# Patient Record
Sex: Female | Born: 1975 | Race: Black or African American | Hispanic: No | Marital: Married | Smoking: Never smoker
Health system: Southern US, Community
[De-identification: ages and names within clinical notes are randomized; demographics above are authoritative.]

## PROBLEM LIST (undated history)

## (undated) DIAGNOSIS — B029 Zoster without complications: Secondary | ICD-10-CM

## (undated) DIAGNOSIS — G40909 Epilepsy, unspecified, not intractable, without status epilepticus: Secondary | ICD-10-CM

## (undated) DIAGNOSIS — I341 Nonrheumatic mitral (valve) prolapse: Secondary | ICD-10-CM

## (undated) DIAGNOSIS — L309 Dermatitis, unspecified: Secondary | ICD-10-CM

## (undated) DIAGNOSIS — J45909 Unspecified asthma, uncomplicated: Secondary | ICD-10-CM

## (undated) HISTORY — PX: KNEE SURGERY: SHX244

## (undated) HISTORY — PX: ORBITAL FRACTURE SURGERY: SHX725

---

## 2019-01-15 ENCOUNTER — Other Ambulatory Visit: Payer: Self-pay

## 2019-01-15 ENCOUNTER — Emergency Department
Admission: EM | Admit: 2019-01-15 | Discharge: 2019-01-15 | Disposition: A | Discharge status: Home / Self Care | Payer: Self-pay | Attending: Emergency Medicine | Admitting: Emergency Medicine

## 2019-01-15 ENCOUNTER — Emergency Department: Payer: Self-pay

## 2019-01-15 ENCOUNTER — Encounter: Payer: Self-pay | Admitting: Emergency Medicine

## 2019-01-15 DIAGNOSIS — Y9389 Activity, other specified: Secondary | ICD-10-CM | POA: Insufficient documentation

## 2019-01-15 DIAGNOSIS — S6392XA Sprain of unspecified part of left wrist and hand, initial encounter: Secondary | ICD-10-CM

## 2019-01-15 DIAGNOSIS — Y999 Unspecified external cause status: Secondary | ICD-10-CM | POA: Insufficient documentation

## 2019-01-15 DIAGNOSIS — S638X2A Sprain of other part of left wrist and hand, initial encounter: Secondary | ICD-10-CM | POA: Insufficient documentation

## 2019-01-15 DIAGNOSIS — Y9289 Other specified places as the place of occurrence of the external cause: Secondary | ICD-10-CM | POA: Insufficient documentation

## 2019-01-15 DIAGNOSIS — X500XXA Overexertion from strenuous movement or load, initial encounter: Secondary | ICD-10-CM | POA: Insufficient documentation

## 2019-01-15 HISTORY — DX: Epilepsy, unspecified, not intractable, without status epilepticus: G40.909

## 2019-01-15 MED ORDER — IBUPROFEN 600 MG PO TABS
600.0000 mg | ORAL_TABLET | Freq: Once | ORAL | Status: AC
  Administered 2019-01-15: 600 mg via ORAL
  Filled 2019-01-15: qty 1

## 2019-01-15 NOTE — Discharge Instructions (Addendum)
Ice, take ibuprofen for pain.  Use Ace wrap for comfort.  Follow-up with your doctor.

## 2019-01-15 NOTE — ED Triage Notes (Signed)
Pt says she reached out to catch her niece from falling yesterday and injured her left 4th digit; no swelling present; pt says she was awakened from sleep with pain in the middle of her hand; says she doesn't think it's broken, just sprained;

## 2019-01-15 NOTE — ED Provider Notes (Signed)
[facility] Emergency Department Provider Note  ____________________________________________  Time seen: Approximately 6:36 AM  I have reviewed the triage vital signs and the nursing notes.   HISTORY  Chief Complaint Hand Pain   HPI Melanie Mccann is a 43 y.o. female who presents for evaluation of left hand pain.  Patient reports that she was holding her niece yesterday to prevent her from falling and started having pain over the fourth and fifth metacarpals.  The pain is mild/moderate, throbbing/soreness, constant, worse with movement of the hand.  No other injuries.  She has not taken anything for the pain   Past Medical History:  Diagnosis Date   Epilepsy ([place])     There are no active problems to display for this patient.   Past Surgical History:  Procedure Laterality Date   KNEE SURGERY Right    ORBITAL FRACTURE SURGERY Left     Prior to Admission medications   Not on File    Allergies Patient has no known allergies.  History reviewed. No pertinent family history.  Social History Social History   Tobacco Use   Smoking status: Never Smoker   Smokeless tobacco: Never Used  Substance Use Topics   Alcohol use: Yes    Comment: seldom   Drug use: Never    Review of Systems  Constitutional: Negative for fever. Eyes: Negative for visual changes. ENT: Negative for sore throat. Neck: No neck pain  Cardiovascular: Negative for chest pain. Respiratory: Negative for shortness of breath. Gastrointestinal: Negative for abdominal pain, vomiting or diarrhea. Genitourinary: Negative for dysuria. Musculoskeletal: Negative for back pain. + L hand pain Skin: Negative for rash. Neurological: Negative for headaches, weakness or numbness. Psych: No SI or HI  ____________________________________________   PHYSICAL EXAM:  VITAL SIGNS: ED Triage Vitals  Enc Vitals Group     BP 01/15/19 0508 117/79     Pulse Rate 01/15/19 0508 79    Resp 01/15/19 0508 16     Temp 01/15/19 0508 99.2 F (37.3 C)     Temp Source 01/15/19 0508 Oral     SpO2 01/15/19 0508 100 %     Weight 01/15/19 0511 155 lb (70.3 kg)     Height 01/15/19 0511 5\' 11"  (1.803 m)     Head Circumference --      Peak Flow --      Pain Score 01/15/19 0511 8     Pain Loc --      Pain Edu? --      Excl. in [place]? --     Constitutional: Alert and oriented. Well appearing and in no apparent distress. HEENT:      Head: Normocephalic and atraumatic.         Eyes: Conjunctivae are normal. Sclera is non-icteric.       Mouth/Throat: Mucous membranes are moist.       Neck: Supple with no signs of meningismus. Cardiovascular: Regular rate and rhythm.  Respiratory: Normal respiratory effort.  Musculoskeletal: No bony deformities or tenderness.  No bruising, no swelling. Neurologic: Normal speech and language. Face is symmetric. Moving all extremities. No gross focal neurologic deficits are appreciated. Skin: Skin is warm, dry and intact. No rash noted. Psychiatric: Mood and affect are normal. Speech and behavior are normal.  ____________________________________________   LABS (all labs ordered are listed, but only abnormal results are displayed)  Labs Reviewed - No data to display ____________________________________________  EKG  none  ____________________________________________  RADIOLOGY  I have personally reviewed the  images performed during this visit and I agree with the Radiologist's read.   Interpretation by Radiologist:  Dg Hand Complete Left  Result Date: 01/15/2019 CLINICAL DATA:  Left hand pain, woke from sleep, suspected injury of the fourth digit EXAM: LEFT HAND - COMPLETE 3+ VIEW COMPARISON:  None. FINDINGS: There is no evidence of fracture or dislocation. There is no evidence of arthropathy or other focal bone abnormality. Soft tissues are unremarkable. IMPRESSION: Negative. Electronically Signed   By: Kreg Shropshire M.D.   On: 01/15/2019  05:42     ____________________________________________   PROCEDURES  Procedure(s) performed: None Procedures Critical Care performed:  None ____________________________________________   INITIAL IMPRESSION / ASSESSMENT AND PLAN / ED COURSE   43 y.o. female who presents for evaluation of left hand pain.  Presentation concerning for sprain.  X-ray with no fracture.  No laceration.  No swelling.  Ace wrap to the hand for comfort.  Ibuprofen given.  Recommended icing and ibuprofen.  Discussed my standard return precautions       As part of my medical decision making, I reviewed the following data within the electronic MEDICAL RECORD NUMBER Nursing notes reviewed and incorporated, Old chart reviewed, Radiograph reviewed , Notes from prior ED visits and [place] Controlled Substance Database   Patient was evaluated in Emergency Department today for the symptoms described in the history of present illness. Patient was evaluated in the context of the global COVID-19 pandemic, which necessitated consideration that the patient might be at risk for infection with the SARS-CoV-2 virus that causes COVID-19. Institutional protocols and algorithms that pertain to the evaluation of patients at risk for COVID-19 are in a state of rapid change based on information released by regulatory bodies including the CDC and federal and state organizations. These policies and algorithms were followed during the patient's care in the ED.   ____________________________________________   FINAL CLINICAL IMPRESSION(S) / ED DIAGNOSES   Final diagnoses:  Sprain of left hand, initial encounter      NEW MEDICATIONS STARTED DURING THIS VISIT:  ED Discharge Orders    None       Note:  This document was prepared using Dragon voice recognition software and may include unintentional dictation errors.    Nita Sickle, MD 01/15/19 [phone]

## 2019-03-01 ENCOUNTER — Emergency Department: Payer: Self-pay

## 2019-03-01 ENCOUNTER — Emergency Department
Admission: EM | Admit: 2019-03-01 | Discharge: 2019-03-02 | Disposition: A | Discharge status: Home / Self Care | Payer: Self-pay | Attending: Student | Admitting: Student

## 2019-03-01 ENCOUNTER — Other Ambulatory Visit: Payer: Self-pay

## 2019-03-01 DIAGNOSIS — Z20828 Contact with and (suspected) exposure to other viral communicable diseases: Secondary | ICD-10-CM | POA: Insufficient documentation

## 2019-03-01 DIAGNOSIS — R05 Cough: Secondary | ICD-10-CM | POA: Insufficient documentation

## 2019-03-01 DIAGNOSIS — R0602 Shortness of breath: Secondary | ICD-10-CM | POA: Insufficient documentation

## 2019-03-01 DIAGNOSIS — R059 Cough, unspecified: Secondary | ICD-10-CM

## 2019-03-01 DIAGNOSIS — R0789 Other chest pain: Secondary | ICD-10-CM | POA: Insufficient documentation

## 2019-03-01 LAB — CBC
HCT: 37.8 % (ref 36.0–46.0)
Hemoglobin: 12.5 g/dL (ref 12.0–15.0)
MCH: 29.2 pg (ref 26.0–34.0)
MCHC: 33.1 g/dL (ref 30.0–36.0)
MCV: 88.3 fL (ref 80.0–100.0)
Platelets: 176 10*3/uL (ref 150–400)
RBC: 4.28 MIL/uL (ref 3.87–5.11)
RDW: 13.5 % (ref 11.5–15.5)
WBC: 5.9 10*3/uL (ref 4.0–10.5)
nRBC: 0 % (ref 0.0–0.2)

## 2019-03-01 LAB — BASIC METABOLIC PANEL
Anion gap: 10 (ref 5–15)
BUN: 10 mg/dL (ref 6–20)
CO2: 23 mmol/L (ref 22–32)
Calcium: 9.4 mg/dL (ref 8.9–10.3)
Chloride: 105 mmol/L (ref 98–111)
Creatinine, Ser: 0.79 mg/dL (ref 0.44–1.00)
GFR calc Af Amer: >60 mL/min (ref >60)
GFR calc non Af Amer: >60 mL/min (ref >60)
Glucose, Bld: 84 mg/dL (ref 70–99)
Potassium: 3.6 mmol/L (ref 3.5–5.1)
Sodium: 138 mmol/L (ref 135–145)

## 2019-03-01 LAB — POC SARS CORONAVIRUS 2 AG: SARS Coronavirus 2 Ag: NEGATIVE

## 2019-03-01 LAB — POCT PREGNANCY, URINE: Preg Test, Ur: NEGATIVE

## 2019-03-01 LAB — FIBRIN DERIVATIVES D-DIMER (ARMC ONLY): Fibrin derivatives D-dimer (ARMC): 222.29 ng/mL (FEU) (ref 0.00–499.00)

## 2019-03-01 LAB — TROPONIN I (HIGH SENSITIVITY)
Troponin I (High Sensitivity): 3 ng/L (ref <18)
Troponin I (High Sensitivity): <2 ng/L (ref <18)

## 2019-03-01 MED ORDER — ALBUTEROL SULFATE HFA 108 (90 BASE) MCG/ACT IN AERS: 2.0000 | INHALATION_SPRAY | Freq: Four times a day (QID) | RESPIRATORY_TRACT | 0 refills | Status: AC | PRN

## 2019-03-01 MED ORDER — SODIUM CHLORIDE 0.9% FLUSH: 3.0000 mL | Freq: Once | INTRAVENOUS | Status: DC

## 2019-03-01 NOTE — ED Notes (Signed)
Recollect green and blue sent to lab

## 2019-03-01 NOTE — ED Triage Notes (Addendum)
Pt arrives to ED via POV from home with c/o CP, SHOB, and non-productive cough. Pt reports cough started on Tuesday, and the CP and SHOB started 1.5 hrs PTA. Pt reports the CP is centralized and non-radiatiating and increases with coughing. Pt denies any c/o N/V/D or fever. No recent Covid testing or exposures. Pt is A&O, in NAD; RR even, regular, and unlabored.

## 2019-03-01 NOTE — ED Notes (Signed)
X-ray at bedside

## 2019-03-01 NOTE — Discharge Instructions (Signed)
Thank you for letting us take care of you in the emergency department.   You were swabbed for COVID today. You should hear your results in 1-3 days if they are positive.   In the meantime, it is important to take precautions in case you are positive. This includes quarantining, wearing a mask, and social distancing.   Continue to take over the counter acetaminophen and ibuprofen as directed on the box to help with fevers as well as aches and pains.   Please return to the ER for any new or worsening symptoms, such as difficulty breathing, vomiting and diarrhea, or chest pain.

## 2019-03-01 NOTE — ED Provider Notes (Signed)
[facility] Emergency Department Provider Note  ____________________________________________   None    (approximate)  I have reviewed the triage vital signs and the nursing notes.  History  Chief Complaint Shortness of Breath, Chest Pain, and Cough    HPI Melanie Mccann is a 43 y.o. female who presents to the ED for chest pain, shortness of breath, nonproductive cough.  Patient reports cough started on Tuesday. Cough is dry and non-productive. A/w generalized fatigue. Chest pain and shortness of breath started 1 to 2 hours prior to arrival here.  Chest pain is central in location, does not radiate, described as an aching, 7/10 in severity.  Chest pain worsens with coughing.  Patient denies any fever, nausea, vomiting, diarrhea.  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   Past Medical Hx Past Medical History:  Diagnosis Date  . Epilepsy (H[facility])     Problem List There are no active problems to display for this patient.   Past Surgical Hx Past Surgical History:  Procedure Laterality Date  . KNEE SURGERY Right   . ORBITAL FRACTURE SURGERY Left     Medications Prior to Admission medications   Not on File    Allergies Patient has no known allergies.  Family Hx No family history on file.  Social Hx Social History   Tobacco Use  . Smoking status: Never Smoker  . Smokeless tobacco: Never Used  Substance Use Topics  . Alcohol use: Yes    Comment: seldom  . Drug use: Never     Review of Systems  Constitutional: Negative for fever, chills. Eyes: Negative for visual changes. ENT: Negative for sore throat. Cardiovascular: + for chest pain. Respiratory: + for cough, shortness of breath. Gastrointestinal: Negative for nausea, vomiting.  Genitourinary: Negative for dysuria. Musculoskeletal: Negative for leg swelling. Skin: Negative for rash. Neurological:  Negative for for headaches.   Physical Exam  Vital Signs: ED Triage Vitals  Enc Vitals Group     BP 03/01/19 2022 (!) 123/94     Pulse Rate 03/01/19 2022 91     Resp 03/01/19 2022 17     Temp 03/01/19 2022 99.3 F (37.4 C)     Temp Source 03/01/19 2022 Oral     SpO2 03/01/19 2022 99 %     Weight 03/01/19 2019 160 lb (72.6 kg)     Height 03/01/19 2019 6' (1.829 m)     Head Circumference --      Peak Flow --      Pain Score 03/01/19 2019 7     Pain Loc --      Pain Edu? --      Excl. in [place]? --     Constitutional: Alert and oriented. Appears somewhat fatigued. Frequent dry cough on exam.  Head: Normocephalic. Atraumatic. Eyes: Conjunctivae clear. Sclera anicteric. Nose: No congestion. No rhinorrhea. Mouth/Throat: Wearing mask.  Neck: No stridor.   Cardiovascular: Normal rate, regular rhythm. Extremities well perfused. Respiratory: Normal respiratory effort.  Lungs CTAB. Satting 99% on RA. No hypoxia. Frequent dry cough.  Gastrointestinal: Soft. Non-tender. Non-distended.  Musculoskeletal: No lower extremity edema. No deformities. Neurologic:  Normal speech and language. No gross focal neurologic deficits are appreciated.  Skin: Skin is warm, dry and intact. No rash noted. Psychiatric: Mood and affect are appropriate for situation.  EKG  Personally reviewed.   Rate: 81 Rhythm: sinus Axis: normal Intervals: WNL TWI in III  No acute ischemic changes No STEMI    Radiology  XR: IMPRESSION:  No active disease.   Procedures  Procedure(s) performed (including critical care):  Procedures   Initial Impression / Assessment and Plan / ED Course  43 y.o. female who presents to the ED for non-productive cough, SOB, chest pain.  Ddx: bronchitis, COVID, PNA, ACS, PE  Will obtain labs, imaging, EKG.  EKG w/o acute ischemic changes. Troponin x 2 negative. XR negative. D-dimer negative. Initial COVID antigen testing negative, follow up PCR swab ordered. Given  negative work up, plan for discharge home. Patient understands her swab is pending at this time, and need for quarantining until results. Advised supportive care, given return precautions. Patient comfortable with plan.      Final Clinical Impression(s) / ED Diagnosis  Final diagnoses:  Cough       Note:  This document was prepared using Dragon voice recognition software and may include unintentional dictation errors.   Lilia Pro., MD 03/01/19 [phone]

## 2019-03-02 LAB — SARS CORONAVIRUS 2 (TAT 6-24 HRS): SARS Coronavirus 2: NEGATIVE

## 2019-03-02 NOTE — ED Notes (Signed)
E-signature not working at this time. Pt verbalized understanding of D/C instructions, prescriptions and follow up care with no further questions at this time. Pt in NAD and ambulatory at time of D/C.  

## 2019-04-23 ENCOUNTER — Encounter: Payer: Self-pay | Admitting: Emergency Medicine

## 2019-04-23 ENCOUNTER — Emergency Department
Admission: EM | Admit: 2019-04-23 | Discharge: 2019-04-23 | Disposition: A | Discharge status: Home / Self Care | Payer: BLUE CROSS/BLUE SHIELD | Attending: Emergency Medicine | Admitting: Emergency Medicine

## 2019-04-23 ENCOUNTER — Other Ambulatory Visit: Payer: Self-pay

## 2019-04-23 DIAGNOSIS — R519 Headache, unspecified: Secondary | ICD-10-CM | POA: Diagnosis present

## 2019-04-23 DIAGNOSIS — K029 Dental caries, unspecified: Secondary | ICD-10-CM | POA: Diagnosis not present

## 2019-04-23 DIAGNOSIS — J45909 Unspecified asthma, uncomplicated: Secondary | ICD-10-CM | POA: Diagnosis not present

## 2019-04-23 DIAGNOSIS — Z79899 Other long term (current) drug therapy: Secondary | ICD-10-CM | POA: Diagnosis not present

## 2019-04-23 DIAGNOSIS — J0100 Acute maxillary sinusitis, unspecified: Secondary | ICD-10-CM | POA: Diagnosis not present

## 2019-04-23 HISTORY — DX: Unspecified asthma, uncomplicated: J45.909

## 2019-04-23 MED ORDER — AMOXICILLIN 875 MG PO TABS: 875.0000 mg | ORAL_TABLET | Freq: Two times a day (BID) | ORAL | 0 refills | Status: DC

## 2019-04-23 MED ORDER — TRAMADOL HCL 50 MG PO TABS: 50.0000 mg | ORAL_TABLET | Freq: Four times a day (QID) | ORAL | 0 refills | Status: AC | PRN

## 2019-04-23 MED ORDER — FEXOFENADINE-PSEUDOEPHED ER 60-120 MG PO TB12: 1.0000 | ORAL_TABLET | Freq: Two times a day (BID) | ORAL | 0 refills | Status: AC

## 2019-04-23 MED ORDER — ACYCLOVIR 400 MG PO TABS: 400.0000 mg | ORAL_TABLET | Freq: Every day | ORAL | 0 refills | Status: AC

## 2019-04-23 NOTE — ED Provider Notes (Signed)
[facility] Emergency Department Provider Note   ____________________________________________   First MD Initiated Contact with Patient 04/23/19 [phone]     (approximate)  I have reviewed the triage vital signs and the nursing notes.   HISTORY  Chief Complaint Facial Pain    HPI Melanie Mccann is a 44 y.o. female patient presents acute  left facial pain.  Patient said the pain began last night.  Patient has a history of poor dentition and herpes zoster.  Patient denies fever associated complaint.  Patient today is a frontal headache.  Denies URI signs or symptoms except nasal congestion.  Patient rates the pain as 10/10.  Patient described the pain as "achy".  No palliative measure for complaint.         Past Medical History:  Diagnosis Date  . Asthma   . Epilepsy (H[facility])     There are no problems to display for this patient.   Past Surgical History:  Procedure Laterality Date  . KNEE SURGERY Right   . ORBITAL FRACTURE SURGERY Left     Prior to Admission medications   Medication Sig Start Date End Date Taking? Authorizing Provider  topiramate (TOPAMAX) 100 MG tablet Take 100 mg by mouth daily. Take at night   Yes [provider]  topiramate (TOPAMAX) 50 MG tablet Take 50 mg by mouth daily.   Yes [provider]  acyclovir (ZOVIRAX) 400 MG tablet Take 1 tablet (400 mg total) by mouth 5 (five) times daily. 04/23/19   Joni Reining, PA-C  albuterol (VENTOLIN HFA) 108 (90 Base) MCG/ACT inhaler Inhale 2 puffs into the lungs every 6 (six) hours as needed for wheezing or shortness of breath. 03/01/19   Miguel Aschoff., MD  amoxicillin (AMOXIL) 875 MG tablet Take 1 tablet (875 mg total) by mouth 2 (two) times daily. 04/23/19   Joni Reining, PA-C  fexofenadine-pseudoephedrine (ALLEGRA-D) 60-120 MG 12 hr tablet Take 1 tablet by mouth 2 (two) times daily. 04/23/19   Joni Reining, PA-C  traMADol (ULTRAM) 50 MG tablet Take 1 tablet (50 mg  total) by mouth every 6 (six) hours as needed. 04/23/19 04/22/20  Joni Reining, PA-C    Allergies Iodine  No family history on file.  Social History Social History   Tobacco Use  . Smoking status: Never Smoker  . Smokeless tobacco: Never Used  Substance Use Topics  . Alcohol use: Yes    Comment: seldom  . Drug use: Never    Review of Systems  Constitutional: No fever/chills Eyes: No visual changes. ENT: No sore throat.Facial pain Cardiovascular: Denies chest pain. Respiratory: Denies shortness of breath. Gastrointestinal: No abdominal pain.  No nausea, no vomiting.  No diarrhea.  No constipation. Genitourinary: Negative for dysuria. Musculoskeletal: Negative for back pain. Skin: Negative for rash. Neurological: Positive for headaches, denies focal weakness or numbness. Allergic/Immunilogical: Iodine ____________________________________________   PHYSICAL EXAM:  VITAL SIGNS: ED Triage Vitals  Enc Vitals Group     BP 04/23/19 0850 121/72     Pulse Rate 04/23/19 0850 88     Resp 04/23/19 0850 18     Temp 04/23/19 0850 98.4 F (36.9 C)     Temp Source 04/23/19 0850 Oral     SpO2 04/23/19 0850 98 %     Weight 04/23/19 0842 160 lb (72.6 kg)     Height 04/23/19 0842 5\' 11"  (1.803 m)     Head Circumference --      Peak Flow --  Pain Score 04/23/19 0842 10     Pain Loc --      Pain Edu? --      Excl. in [place]? --    Constitutional: Alert and oriented. Well appearing and in no acute distress. Eyes: Conjunctivae are normal. PERRL. EOMI. Head: Atraumatic. Nose: No congestion/rhinnorhea.  Tenderness nasal turbinates. Mouth/Throat: Mucous membranes are moist.  Oropharynx non-erythematous.  Dental caries and gingivitis. Neck: No stridor.   Hematological/Lymphatic/Immunilogical: No cervical lymphadenopathy. Cardiovascular: Normal rate, regular rhythm. Grossly normal heart sounds.  Good peripheral circulation. Respiratory: Normal respiratory effort.  No retractions.  Lungs CTAB. Genitourinary: Deferred Musculoskeletal: No lower extremity tenderness nor edema.  No joint effusions. Neurologic:  Normal speech and language. No gross focal neurologic deficits are appreciated. No gait instability. Skin:  Skin is warm, dry and intact. No rash noted. Psychiatric: Mood and affect are normal. Speech and behavior are normal.  ____________________________________________   LABS (all labs ordered are listed, but only abnormal results are displayed)  Labs Reviewed - No data to display ____________________________________________  EKG   ____________________________________________  RADIOLOGY  ED MD interpretation:    Official radiology report(s): No results found.  ____________________________________________   PROCEDURES  Procedure(s) performed (including Critical Care):  Procedures   ____________________________________________   INITIAL IMPRESSION / ASSESSMENT AND PLAN / ED COURSE  As part of my medical decision making, I reviewed the following data within the [place]     Patient presents with cute onset of left facial pain.  Differential consist of dental pain, onset herpes zoster, sinusitis.  Patient physical exam is consistent with sinusitis.  Patient given discharge care instruction advised take medication as directed.  Patient advised [facility] with [facility]  Melanie Mccann was evaluated in Emergency Department on 04/23/2019 for the symptoms described in the history of present illness. She was evaluated in the context of the global COVID-19 pandemic, which necessitated consideration that the patient might be at risk for infection with the SARS-CoV-2 virus that causes COVID-19. Institutional protocols and algorithms that pertain to the evaluation of patients at risk for COVID-19 are in a state of rapid change based on information released by regulatory bodies including the CDC and federal and state  organizations. These policies and algorithms were followed during the patient's care in the ED.           ____________________________________________   FINAL CLINICAL IMPRESSION(S) / ED DIAGNOSES  Final diagnoses:  Subacute maxillary sinusitis     ED Discharge Orders         Ordered    amoxicillin (AMOXIL) 875 MG tablet  2 times daily     04/23/19 0922    fexofenadine-pseudoephedrine (ALLEGRA-D) 60-120 MG 12 hr tablet  2 times daily     04/23/19 0922    acyclovir (ZOVIRAX) 400 MG tablet  5 times daily     04/23/19 0924    traMADol (ULTRAM) 50 MG tablet  Every 6 hours PRN     04/23/19 1287           Note:  This document was prepared using Dragon voice recognition software and may include unintentional dictation errors.    Jillane, Po, PA-C 04/23/19 0930    Earleen Newport, MD 04/23/19 [phone]

## 2019-04-23 NOTE — ED Triage Notes (Signed)
Pt reports yesterday she started with pain to her upper left jaw and today she had some swelling. Pt unsure if this is related tooth or what.

## 2019-04-23 NOTE — ED Notes (Signed)
See triage note  Presents with left side facial swelling  Denies any dental pain   Swelling noted to left side of nose and under eye  Afebrile on arrival

## 2019-04-24 ENCOUNTER — Emergency Department: Payer: BLUE CROSS/BLUE SHIELD

## 2019-04-24 ENCOUNTER — Encounter: Payer: Self-pay | Admitting: Emergency Medicine

## 2019-04-24 ENCOUNTER — Emergency Department
Admission: EM | Admit: 2019-04-24 | Discharge: 2019-04-24 | Disposition: A | Discharge status: Home / Self Care | Payer: BLUE CROSS/BLUE SHIELD | Attending: Emergency Medicine | Admitting: Emergency Medicine

## 2019-04-24 ENCOUNTER — Other Ambulatory Visit: Payer: Self-pay

## 2019-04-24 DIAGNOSIS — Z79899 Other long term (current) drug therapy: Secondary | ICD-10-CM | POA: Insufficient documentation

## 2019-04-24 DIAGNOSIS — R220 Localized swelling, mass and lump, head: Secondary | ICD-10-CM | POA: Diagnosis not present

## 2019-04-24 DIAGNOSIS — J45909 Unspecified asthma, uncomplicated: Secondary | ICD-10-CM | POA: Insufficient documentation

## 2019-04-24 DIAGNOSIS — K047 Periapical abscess without sinus: Secondary | ICD-10-CM | POA: Diagnosis not present

## 2019-04-24 DIAGNOSIS — M272 Inflammatory conditions of jaws: Secondary | ICD-10-CM | POA: Diagnosis not present

## 2019-04-24 DIAGNOSIS — R6884 Jaw pain: Secondary | ICD-10-CM | POA: Diagnosis present

## 2019-04-24 LAB — CBC WITH DIFFERENTIAL/PLATELET
Abs Immature Granulocytes: 0.01 K/uL (ref 0.00–0.07)
Basophils Absolute: 0 K/uL (ref 0.0–0.1)
Basophils Relative: 1 %
Eosinophils Absolute: 0.1 K/uL (ref 0.0–0.5)
Eosinophils Relative: 2 %
HCT: 41.3 % (ref 36.0–46.0)
Hemoglobin: 13.5 g/dL (ref 12.0–15.0)
Immature Granulocytes: 0 %
Lymphocytes Relative: 17 %
Lymphs Abs: 1.3 K/uL (ref 0.7–4.0)
MCH: 29.1 pg (ref 26.0–34.0)
MCHC: 32.7 g/dL (ref 30.0–36.0)
MCV: 89 fL (ref 80.0–100.0)
Monocytes Absolute: 0.8 K/uL (ref 0.1–1.0)
Monocytes Relative: 11 %
Neutro Abs: 5.1 K/uL (ref 1.7–7.7)
Neutrophils Relative %: 69 %
Platelets: 155 K/uL (ref 150–400)
RBC: 4.64 MIL/uL (ref 3.87–5.11)
RDW: 13.2 % (ref 11.5–15.5)
WBC: 7.3 K/uL (ref 4.0–10.5)
nRBC: 0 % (ref 0.0–0.2)

## 2019-04-24 LAB — BASIC METABOLIC PANEL WITH GFR
Anion gap: 8 (ref 5–15)
BUN: 9 mg/dL (ref 6–20)
CO2: 26 mmol/L (ref 22–32)
Calcium: 9.1 mg/dL (ref 8.9–10.3)
Chloride: 101 mmol/L (ref 98–111)
Creatinine, Ser: 0.78 mg/dL (ref 0.44–1.00)
GFR calc Af Amer: >60 mL/min
GFR calc non Af Amer: >60 mL/min
Glucose, Bld: 97 mg/dL (ref 70–99)
Potassium: 3.7 mmol/L (ref 3.5–5.1)
Sodium: 135 mmol/L (ref 135–145)

## 2019-04-24 LAB — POCT PREGNANCY, URINE: Preg Test, Ur: NEGATIVE

## 2019-04-24 MED ORDER — KETOROLAC TROMETHAMINE 30 MG/ML IJ SOLN
15.0000 mg | INTRAMUSCULAR | Status: AC
  Administered 2019-04-24: 15 mg via INTRAVENOUS
  Filled 2019-04-24: qty 1

## 2019-04-24 MED ORDER — KETOROLAC TROMETHAMINE 10 MG PO TABS: 10.0000 mg | ORAL_TABLET | Freq: Four times a day (QID) | ORAL | 0 refills | Status: DC | PRN

## 2019-04-24 MED ORDER — IOHEXOL 300 MG/ML  SOLN
75.0000 mL | Freq: Once | INTRAMUSCULAR | Status: AC | PRN
  Administered 2019-04-24: 09:00:00 75 mL via INTRAVENOUS

## 2019-04-24 MED ORDER — SODIUM CHLORIDE 0.9 % IV BOLUS
1000.0000 mL | Freq: Once | INTRAVENOUS | Status: AC
  Administered 2019-04-24: 08:00:00 1000 mL via INTRAVENOUS

## 2019-04-24 NOTE — Discharge Instructions (Signed)
Continue taking the amoxicillin and tramadol as previously prescribed. You should make an appointment to follow up with a dentist in one week.  We have prescribed toradol as well to improve pain control.

## 2019-04-24 NOTE — ED Provider Notes (Signed)
[facility] Emergency Department Provider Note  ____________________________________________  Time seen: Approximately 8:10 AM  I have reviewed the triage vital signs and the nursing notes.   HISTORY  Chief Complaint Oral Swelling    HPI Melanie Mccann is a 44 y.o. female with a history of asthma who comes the ED complaining of left jaw pain and swelling, started  2 days ago.  Constant, gradually worsening.  Nonradiating.  No aggravating or alleviating factors, not relieved by ibuprofen.  Came to the ED yesterday and prescribed amoxicillin for maxillary sinusitis with noted poor dentition but without evidence of odontogenic infection.  Patient reports that she is taken 3 doses of the amoxicillin without improvement so far.  Denies difficulty breathing or swallowing.  Has decreased appetite but is tolerating fluids just fine.  Denies headache body aches fevers chills sweats vomiting or diarrhea.     Past Medical History:  Diagnosis Date  . Asthma   . Epilepsy (HCC)      There are no problems to display for this patient.    Past Surgical History:  Procedure Laterality Date  . KNEE SURGERY Right   . ORBITAL FRACTURE SURGERY Left      Prior to Admission medications   Medication Sig Start Date End Date Taking? Authorizing Provider  acyclovir (ZOVIRAX) 400 MG tablet Take 1 tablet (400 mg total) by mouth 5 (five) times daily. 04/23/19   Joni Reining, PA-C  albuterol (VENTOLIN HFA) 108 (90 Base) MCG/ACT inhaler Inhale 2 puffs into the lungs every 6 (six) hours as needed for wheezing or shortness of breath. 03/01/19   Miguel Aschoff., MD  amoxicillin (AMOXIL) 875 MG tablet Take 1 tablet (875 mg total) by mouth 2 (two) times daily. 04/23/19   Joni Reining, PA-C  fexofenadine-pseudoephedrine (ALLEGRA-D) 60-120 MG 12 hr tablet Take 1 tablet by mouth 2 (two) times daily. 04/23/19   Joni Reining, PA-C  ketorolac (TORADOL) 10 MG tablet Take 1 tablet (10 mg  total) by mouth every 6 (six) hours as needed for moderate pain. 04/24/19   Sharman Cheek, MD  topiramate (TOPAMAX) 100 MG tablet Take 100 mg by mouth daily. Take at night    [provider]  topiramate (TOPAMAX) 50 MG tablet Take 50 mg by mouth daily.    [provider]  traMADol (ULTRAM) 50 MG tablet Take 1 tablet (50 mg total) by mouth every 6 (six) hours as needed. 04/23/19 04/22/20  Joni Reining, PA-C     Allergies Iodine   No family history on file.  Social History Social History   Tobacco Use  . Smoking status: Never Smoker  . Smokeless tobacco: Never Used  Substance Use Topics  . Alcohol use: Yes    Comment: seldom  . Drug use: Never    Review of Systems  Constitutional:   No fever or chills.  ENT:   No sore throat. No rhinorrhea.  Left-sided jaw pain as above Cardiovascular:   No chest pain or syncope. Respiratory:   No dyspnea or cough. Gastrointestinal:   Negative for abdominal pain, vomiting and diarrhea.  Musculoskeletal:   Negative for focal pain or swelling All other systems reviewed and are negative except as documented above in ROS and HPI.  ____________________________________________   PHYSICAL EXAM:  VITAL SIGNS: ED Triage Vitals  Enc Vitals Group     BP 04/24/19 0712 109/75     Pulse Rate 04/24/19 0712 (!) 105     Resp 04/24/19 7829  18     Temp 04/24/19 0712 98.8 F (37.1 C)     Temp Source 04/24/19 0712 Oral     SpO2 04/24/19 0712 100 %     Weight 04/24/19 0713 160 lb (72.6 kg)     Height 04/24/19 0713 5\' 11"  (1.803 m)     Head Circumference --      Peak Flow --      Pain Score 04/24/19 0712 10     Pain Loc --      Pain Edu? --      Excl. in [place]? --     Vital signs reviewed, nursing assessments reviewed.   Constitutional:   Alert and oriented. Non-toxic appearance. Eyes:   Conjunctivae are normal. EOMI. ENT      Head:   Normocephalic and atraumatic.      Mouth/Throat:   MMM.  Poor dentition.  Swelling of  left lower jaw.  Uvula midline, no tonsillar asymmetry or mass      Neck:   No meningismus. Full ROM. Hematological/Lymphatic/Immunilogical:   Positive left anterior cervical lymphadenopathy. Cardiovascular:   RRR. Symmetric bilateral radial and DP pulses.  No murmurs. Cap refill less than 2 seconds. Respiratory:   Normal respiratory effort without tachypnea/retractions. Breath sounds are clear and equal bilaterally. No wheezes/rales/rhonchi. Musculoskeletal:   Normal range of motion in all extremities.  No edema. Neurologic:   Normal speech and language.  Motor grossly intact. No acute focal neurologic deficits are appreciated.  Skin:    Skin is warm, dry and intact. No rash noted.  No wounds.  ____________________________________________    LABS (pertinent positives/negatives) (all labs ordered are listed, but only abnormal results are displayed) Labs Reviewed  BASIC METABOLIC PANEL  CBC WITH DIFFERENTIAL/PLATELET  POC URINE PREG, ED  POCT PREGNANCY, URINE   ____________________________________________   EKG  ____________________________________________    RADIOLOGY  CT SOFT TISSUE NECK W CONTRAST  Result Date: 04/24/2019 CLINICAL DATA:  Worsening left jaw and neck swelling despite antibiotics; neck abscess, deep tissue. Additional history provided by scanning technologist: Patient reports pain on left side of face, swelling noted. EXAM: CT NECK WITH CONTRAST TECHNIQUE: Multidetector CT imaging of the neck was performed using the standard protocol following the bolus administration of intravenous contrast. CONTRAST:  36mL OMNIPAQUE IOHEXOL 300 MG/ML  SOLN COMPARISON:  No pertinent prior studies available for comparison. FINDINGS: Pharynx and larynx: No appreciable swelling or discrete mass within the oral cavity, pharynx or larynx. Salivary glands: No inflammation, mass, or stone. Thyroid: Unremarkable. Lymph nodes: No pathologically enlarged cervical chain lymph nodes. Vascular:  The major vascular structures of the neck appear patent. Limited intracranial: No abnormality identified. Visualized orbits: No abnormality. Mastoids and visualized paranasal sinuses: Moderate mucosal thickening within the inferior left maxillary sinus. This may reflect odontogenic sinusitis as the roots of the left maxillary first and molar extend into the inferior aspect of the left maxillary sinus and demonstrates subtle periapical lucency. The paranasal sinuses are otherwise well aerated. No significant mastoid effusion. Skeleton: No acute bony abnormality. Upper chest: No consolidation within the imaged lung apices. Other: There is prominent inflammatory soft tissue and phlegmon along the left mandible and maxilla consistent. No organized soft tissue abscess is identified on the current exam. There is mild periapical lucency surrounding the left maxillary first molar. Additionally, the left maxillary first molar is carious (series 5, image 79). Subtle periapical lucency is also questioned along the left mandibular first molar (series 5, image 79), although this is not  definite. There is an unerupted left mandibular third molar. IMPRESSION: Cellulitis and phlegmon along the left mandible and maxilla. No organized soft tissue abscess is demonstrated at this time. Findings may be odontogenic in origin. The left maxillary first molar is carious and demonstrates subtle periapical lucency. Subtle periapical lucency is also questioned along the left mandibular first molar, although this is not definite. Moderate mucosal thickening within the inferior left maxillary sinus, suspected odontogenic as described. Electronically Signed   By: Jackey Loge DO   On: 04/24/2019 09:29    ____________________________________________   PROCEDURES Procedures  ____________________________________________  CLINICAL IMPRESSION / ASSESSMENT AND PLAN / ED COURSE  Pertinent labs & imaging results that were available during my  care of the patient were reviewed by me and considered in my medical decision making (see chart for details).  Melanie Mccann was evaluated in Emergency Department on 04/24/2019 for the symptoms described in the history of present illness. She was evaluated in the context of the global COVID-19 pandemic, which necessitated consideration that the patient might be at risk for infection with the SARS-CoV-2 virus that causes COVID-19. Institutional protocols and algorithms that pertain to the evaluation of patients at risk for COVID-19 are in a state of rapid change based on information released by regulatory bodies including the CDC and federal and state organizations. These policies and algorithms were followed during the patient's care in the ED.   Patient returns to ED for worsening pain and swelling of left lower jaw despite amoxicillin use.  Has associated lymphadenopathy.  At this point, I suspect a developing odontogenic infection.  Will obtain CT scan to rule out a deep space abscess, and if so, can continue amoxicillin given that she is only 24 hours into antibiotic treatment.  Clinical Course as of Apr 23 1017  Tue Apr 24, 2019  0924 CT SOFT TISSUE NECK W CONTRAST [LP]  [phone] CT shows evidence of odontogenic infection involving from left maxillary first molar.  There is evidence of cellulitis without abscess.  Given that she is only 24 hours into antibiotic treatment with amoxicillin I will continue this for now, continue the tramadol she was prescribed yesterday, prescribed Toradol as well for better NSAID effect.  Recommend follow-up with dentistry.   [PS]    Clinical Course User Index [LP] Sofie Rower, Student-PA [PS] Sharman Cheek, MD     ____________________________________________   FINAL CLINICAL IMPRESSION(S) / ED DIAGNOSES    Final diagnoses:  Odontogenic infection of jaw  Dental abscess     ED Discharge Orders         Ordered    ketorolac (TORADOL) 10 MG tablet   Every 6 hours PRN     04/24/19 1018          Portions of this note were generated with dragon dictation software. Dictation errors may occur despite best attempts at proofreading.   Sharman Cheek, MD 04/24/19 1019

## 2019-04-24 NOTE — ED Notes (Addendum)
Pt discharged home after verbalizing understanding of discharge instructions; nad noted. 

## 2019-04-24 NOTE — ED Triage Notes (Signed)
Pt here with c/o pain on left side of her face, swelling noted, appears dental in nature, pt unsure if it's dental related or not.

## 2019-05-10 ENCOUNTER — Other Ambulatory Visit: Payer: BLUE CROSS/BLUE SHIELD

## 2019-07-01 ENCOUNTER — Emergency Department
Admission: EM | Admit: 2019-07-01 | Discharge: 2019-07-01 | Disposition: A | Discharge status: Home / Self Care | Payer: BLUE CROSS/BLUE SHIELD | Attending: Student | Admitting: Student

## 2019-07-01 ENCOUNTER — Other Ambulatory Visit: Payer: Self-pay

## 2019-07-01 DIAGNOSIS — Z79899 Other long term (current) drug therapy: Secondary | ICD-10-CM | POA: Insufficient documentation

## 2019-07-01 DIAGNOSIS — J45909 Unspecified asthma, uncomplicated: Secondary | ICD-10-CM | POA: Diagnosis not present

## 2019-07-01 DIAGNOSIS — R220 Localized swelling, mass and lump, head: Secondary | ICD-10-CM | POA: Diagnosis present

## 2019-07-01 DIAGNOSIS — T7840XA Allergy, unspecified, initial encounter: Secondary | ICD-10-CM | POA: Insufficient documentation

## 2019-07-01 DIAGNOSIS — R21 Rash and other nonspecific skin eruption: Secondary | ICD-10-CM | POA: Diagnosis not present

## 2019-07-01 DIAGNOSIS — L509 Urticaria, unspecified: Secondary | ICD-10-CM | POA: Insufficient documentation

## 2019-07-01 LAB — COMPREHENSIVE METABOLIC PANEL
ALT: 25 U/L (ref 0–44)
AST: 26 U/L (ref 15–41)
Albumin: 4.2 g/dL (ref 3.5–5.0)
Alkaline Phosphatase: 42 U/L (ref 38–126)
Anion gap: 6 (ref 5–15)
BUN: 13 mg/dL (ref 6–20)
CO2: 26 mmol/L (ref 22–32)
Calcium: 8.9 mg/dL (ref 8.9–10.3)
Chloride: 105 mmol/L (ref 98–111)
Creatinine, Ser: 0.74 mg/dL (ref 0.44–1.00)
GFR calc Af Amer: >60 mL/min (ref >60)
GFR calc non Af Amer: >60 mL/min (ref >60)
Glucose, Bld: 74 mg/dL (ref 70–99)
Potassium: 3.3 mmol/L — ABNORMAL LOW (ref 3.5–5.1)
Sodium: 137 mmol/L (ref 135–145)
Total Bilirubin: 1.2 mg/dL (ref 0.3–1.2)
Total Protein: 7.1 g/dL (ref 6.5–8.1)

## 2019-07-01 LAB — CBC WITH DIFFERENTIAL/PLATELET
Abs Immature Granulocytes: 0.01 10*3/uL (ref 0.00–0.07)
Basophils Absolute: 0 10*3/uL (ref 0.0–0.1)
Basophils Relative: 1 %
Eosinophils Absolute: 0.6 10*3/uL — ABNORMAL HIGH (ref 0.0–0.5)
Eosinophils Relative: 10 %
HCT: 37.7 % (ref 36.0–46.0)
Hemoglobin: 12.5 g/dL (ref 12.0–15.0)
Immature Granulocytes: 0 %
Lymphocytes Relative: 32 %
Lymphs Abs: 1.9 10*3/uL (ref 0.7–4.0)
MCH: 29.1 pg (ref 26.0–34.0)
MCHC: 33.2 g/dL (ref 30.0–36.0)
MCV: 87.9 fL (ref 80.0–100.0)
Monocytes Absolute: 0.4 10*3/uL (ref 0.1–1.0)
Monocytes Relative: 7 %
Neutro Abs: 3 10*3/uL (ref 1.7–7.7)
Neutrophils Relative %: 50 %
Platelets: 162 10*3/uL (ref 150–400)
RBC: 4.29 MIL/uL (ref 3.87–5.11)
RDW: 13.6 % (ref 11.5–15.5)
WBC: 5.9 10*3/uL (ref 4.0–10.5)
nRBC: 0 % (ref 0.0–0.2)

## 2019-07-01 MED ORDER — FAMOTIDINE 20 MG PO TABS: 20.0000 mg | ORAL_TABLET | Freq: Two times a day (BID) | ORAL | 0 refills | Status: AC

## 2019-07-01 MED ORDER — FAMOTIDINE IN NACL 20-0.9 MG/50ML-% IV SOLN
20.0000 mg | Freq: Once | INTRAVENOUS | Status: AC
  Administered 2019-07-01: 20 mg via INTRAVENOUS
  Filled 2019-07-01: qty 50

## 2019-07-01 MED ORDER — CETIRIZINE HCL 10 MG PO TABS: 10.0000 mg | ORAL_TABLET | Freq: Every day | ORAL | 0 refills | Status: AC

## 2019-07-01 MED ORDER — METHYLPREDNISOLONE SODIUM SUCC 125 MG IJ SOLR
125.0000 mg | Freq: Once | INTRAMUSCULAR | Status: AC
  Administered 2019-07-01: 125 mg via INTRAVENOUS
  Filled 2019-07-01: qty 2

## 2019-07-01 MED ORDER — PREDNISONE 10 MG (21) PO TBPK: ORAL_TABLET | ORAL | 0 refills | Status: DC

## 2019-07-01 NOTE — ED Triage Notes (Signed)
Pt arrives via POV with complaints of an allergic reaction. PT is experiencing swelling and hives on the face, eyes, lips, neck, and arms. Pt denies any swelling in the mouth, on tongue or throat. Denies SOB.   Unknown what caused allergic reaction. Symptoms started at 4PM and pt took benadryl at home. Reports feeling as though it is getting worse

## 2019-07-01 NOTE — Discharge Instructions (Addendum)
Thank you for letting us take care of you in the emergency department today.   Please continue to take any regular, prescribed medications.   New medications we have prescribed:  Cetirizine Prednisone taper Famotidine  Please follow up with: Your primary care doctor to review your ER visit and follow up on your symptoms.    Please return to the ER for any new or worsening symptoms.

## 2019-07-02 NOTE — ED Provider Notes (Signed)
[facility] Emergency Department Provider Note  ____________________________________________   First MD Initiated Contact with Patient 07/01/19 2209     (approximate)  I have reviewed the triage vital signs and the nursing notes.  History  Chief Complaint Allergic Reaction    HPI Melanie Mccann is a 44 y.o. female w/ hx of eczema who presents for allergic reaction. She states this afternoon she developed eye swelling and rash to her chest and arms. Describes hives, itching. She is unsure what may have caused it. Has allergies to shellfish but denies any ingestion today. No new identifiable exposures, lotions, creams, etc. Took Benadryl at home. Denies any lip or tongue swelling. No wheezing, trouble breathing, vomiting, or diarrhea. Received famotidine and Solumedrol prior to my evaluation and reports significant improvement, resolution of rash and eye swelling, still feels just a little bit itchy.     Past Medical Hx Past Medical History:  Diagnosis Date  . Asthma   . Epilepsy (HCC)     Problem List There are no problems to display for this patient.   Past Surgical Hx Past Surgical History:  Procedure Laterality Date  . KNEE SURGERY Right   . ORBITAL FRACTURE SURGERY Left     Medications Prior to Admission medications   Medication Sig Start Date End Date Taking? Authorizing Provider  acyclovir (ZOVIRAX) 400 MG tablet Take 1 tablet (400 mg total) by mouth 5 (five) times daily. 04/23/19   Joni Reining, PA-C  albuterol (VENTOLIN HFA) 108 (90 Base) MCG/ACT inhaler Inhale 2 puffs into the lungs every 6 (six) hours as needed for wheezing or shortness of breath. 03/01/19   Miguel Aschoff., MD  amoxicillin (AMOXIL) 875 MG tablet Take 1 tablet (875 mg total) by mouth 2 (two) times daily. 04/23/19   Joni Reining, PA-C  cetirizine (ZYRTEC ALLERGY) 10 MG tablet Take 1 tablet (10 mg total) by mouth daily for 7 days. 07/01/19 07/08/19  Miguel Aschoff., MD    famotidine (PEPCID) 20 MG tablet Take 1 tablet (20 mg total) by mouth 2 (two) times daily for 7 days. 07/01/19 07/08/19  Miguel Aschoff., MD  fexofenadine-pseudoephedrine (ALLEGRA-D) 60-120 MG 12 hr tablet Take 1 tablet by mouth 2 (two) times daily. 04/23/19   Joni Reining, PA-C  ketorolac (TORADOL) 10 MG tablet Take 1 tablet (10 mg total) by mouth every 6 (six) hours as needed for moderate pain. 04/24/19   Sharman Cheek, MD  predniSONE (STERAPRED UNI-PAK 21 TAB) 10 MG (21) TBPK tablet Take as directed on the box. 07/01/19   Miguel Aschoff., MD  topiramate (TOPAMAX) 100 MG tablet Take 100 mg by mouth daily. Take at night    [provider]  topiramate (TOPAMAX) 50 MG tablet Take 50 mg by mouth daily.    [provider]  traMADol (ULTRAM) 50 MG tablet Take 1 tablet (50 mg total) by mouth every 6 (six) hours as needed. 04/23/19 04/22/20  Joni Reining, PA-C    Allergies Shellfish allergy, Betadine [povidone iodine], and Iodine  Family Hx History reviewed. No pertinent family history.  Social Hx Social History   Tobacco Use  . Smoking status: Never Smoker  . Smokeless tobacco: Never Used  Substance Use Topics  . Alcohol use: Yes    Comment: seldom  . Drug use: Never     Review of Systems  Constitutional: Negative for fever. Negative for chills. Eyes: Negative for visual changes. ENT: Negative for sore throat. Cardiovascular: Negative for  chest pain. Respiratory: Negative for shortness of breath. Gastrointestinal: Negative for nausea. Negative for vomiting.  Genitourinary: Negative for dysuria. Musculoskeletal: Negative for leg swelling. Skin: + for rash. Neurological: Negative for headaches.   Physical Exam  Vital Signs: ED Triage Vitals  Enc Vitals Group     BP 07/01/19 1902 128/77     Pulse Rate 07/01/19 1902 83     Resp 07/01/19 1902 18     Temp 07/01/19 1902 98.7 F (37.1 C)     Temp Source 07/01/19 1902 Oral     SpO2 07/01/19 1902 100 %      Weight 07/01/19 1903 162 lb (73.5 kg)     Height 07/01/19 1903 5\' 11"  (1.803 m)     Head Circumference --      Peak Flow --      Pain Score 07/01/19 1903 0     Pain Loc --      Pain Edu? --      Excl. in ? --     Constitutional: Alert and oriented. Well appearing. NAD.  Head: Normocephalic. Atraumatic. Eyes: Conjunctivae clear. Sclera anicteric. Pupils equal and symmetric. Sequelae of recent swelling but no active or currently periorbital edema or swelling.  Nose: No masses or lesions. No congestion or rhinorrhea. Mouth/Throat: Wearing mask. No lip, tongue, or uvular swelling. Neck: No stridor. Trachea midline.  Cardiovascular: Normal rate, regular rhythm. Extremities well perfused. Respiratory: Normal respiratory effort.  Lungs CTAB. No wheezing. Gastrointestinal: Soft. Non-distended. Non-tender.  Genitourinary: Deferred. Musculoskeletal: No lower extremity edema. No deformities. Neurologic:  Normal speech and language. No gross focal or lateralizing neurologic deficits are appreciated.  Skin: Skin is warm, dry and intact. No rash noted, no uriticaria, hives, or wheals. Dried, eczematous skin.  Psychiatric: Mood and affect are appropriate for situation.  EKG  N/A    Radiology   N/A   Procedures  Procedure(s) performed (including critical care):  Procedures   Initial Impression / Assessment and Plan / MDM / ED Course  44 y.o. female who presents to the ED for allergic reaction to unknown trigger. No evidence of anaphylaxis by history or exam.   Benadryl at home, received famotidine and Solumedrol here w/ resolution in symptoms. No lip/tongue/uvular swelling. HDS w/o evidence of respiratory compromise.   Will plan for discharge with Rx for anti-histamines, famotidine, and steroid pack. Patient agreeable. Given return precautions.  _______________________________   As part of my medical decision making I have reviewed available labs, radiology tests, reviewed  old records.  Final Clinical Impression(s) / ED Diagnosis  Final diagnoses:  Allergic reaction, initial encounter       Note:  This document was prepared using Dragon voice recognition software and may include unintentional dictation errors.   Lilia Pro., MD 07/02/19 1255

## 2019-07-25 ENCOUNTER — Encounter: Payer: Self-pay | Admitting: Emergency Medicine

## 2019-07-25 ENCOUNTER — Emergency Department: Payer: BLUE CROSS/BLUE SHIELD

## 2019-07-25 ENCOUNTER — Other Ambulatory Visit: Payer: Self-pay

## 2019-07-25 ENCOUNTER — Emergency Department
Admission: EM | Admit: 2019-07-25 | Discharge: 2019-07-25 | Disposition: A | Discharge status: Home / Self Care | Payer: BLUE CROSS/BLUE SHIELD | Attending: Emergency Medicine | Admitting: Emergency Medicine

## 2019-07-25 DIAGNOSIS — J069 Acute upper respiratory infection, unspecified: Secondary | ICD-10-CM | POA: Insufficient documentation

## 2019-07-25 DIAGNOSIS — Z79899 Other long term (current) drug therapy: Secondary | ICD-10-CM | POA: Insufficient documentation

## 2019-07-25 DIAGNOSIS — J45909 Unspecified asthma, uncomplicated: Secondary | ICD-10-CM | POA: Insufficient documentation

## 2019-07-25 DIAGNOSIS — R05 Cough: Secondary | ICD-10-CM | POA: Diagnosis present

## 2019-07-25 LAB — BASIC METABOLIC PANEL
Anion gap: 6 (ref 5–15)
BUN: 10 mg/dL (ref 6–20)
CO2: 22 mmol/L (ref 22–32)
Calcium: 8.3 mg/dL — ABNORMAL LOW (ref 8.9–10.3)
Chloride: 108 mmol/L (ref 98–111)
Creatinine, Ser: 0.72 mg/dL (ref 0.44–1.00)
GFR calc Af Amer: >60 mL/min (ref >60)
GFR calc non Af Amer: >60 mL/min (ref >60)
Glucose, Bld: 100 mg/dL — ABNORMAL HIGH (ref 70–99)
Potassium: 3.9 mmol/L (ref 3.5–5.1)
Sodium: 136 mmol/L (ref 135–145)

## 2019-07-25 LAB — CBC
HCT: 35 % — ABNORMAL LOW (ref 36.0–46.0)
Hemoglobin: 11.3 g/dL — ABNORMAL LOW (ref 12.0–15.0)
MCH: 29 pg (ref 26.0–34.0)
MCHC: 32.3 g/dL (ref 30.0–36.0)
MCV: 89.7 fL (ref 80.0–100.0)
Platelets: 189 10*3/uL (ref 150–400)
RBC: 3.9 MIL/uL (ref 3.87–5.11)
RDW: 13.8 % (ref 11.5–15.5)
WBC: 4.3 10*3/uL (ref 4.0–10.5)
nRBC: 0 % (ref 0.0–0.2)

## 2019-07-25 LAB — TROPONIN I (HIGH SENSITIVITY): Troponin I (High Sensitivity): 8 ng/L (ref <18)

## 2019-07-25 LAB — FIBRIN DERIVATIVES D-DIMER (ARMC ONLY): Fibrin derivatives D-dimer (ARMC): 242.3 ng/mL (FEU) (ref 0.00–499.00)

## 2019-07-25 MED ORDER — IPRATROPIUM-ALBUTEROL 0.5-2.5 (3) MG/3ML IN SOLN
3.0000 mL | Freq: Once | RESPIRATORY_TRACT | Status: AC
  Administered 2019-07-25: 3 mL via RESPIRATORY_TRACT
  Filled 2019-07-25: qty 3

## 2019-07-25 MED ORDER — AZITHROMYCIN 250 MG PO TABS: ORAL_TABLET | ORAL | 0 refills | Status: DC

## 2019-07-25 MED ORDER — TRIAMCINOLONE ACETONIDE 0.5 % EX OINT: 1.0000 "application " | TOPICAL_OINTMENT | Freq: Two times a day (BID) | CUTANEOUS | 0 refills | Status: AC

## 2019-07-25 MED ORDER — PREDNISONE 20 MG PO TABS
60.0000 mg | ORAL_TABLET | Freq: Once | ORAL | Status: AC
  Administered 2019-07-25: 60 mg via ORAL
  Filled 2019-07-25: qty 3

## 2019-07-25 MED ORDER — PREDNISONE 20 MG PO TABS: 60.0000 mg | ORAL_TABLET | Freq: Every day | ORAL | 0 refills | Status: AC

## 2019-07-25 MED ORDER — SODIUM CHLORIDE 0.9 % IV BOLUS
1000.0000 mL | Freq: Once | INTRAVENOUS | Status: AC
  Administered 2019-07-25: 1000 mL via INTRAVENOUS
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
## 2019-07-25 NOTE — ED Notes (Signed)
Care of patient, patient states hx of asthma but feels like her symptoms are more cold related. Patient with clear lung fields, able to expectorate. Describes sputum as yellowish / greenish. Vss. Evaluated by md meds administered as ordered
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
## 2019-07-26 DIAGNOSIS — J4520 Mild intermittent asthma, uncomplicated: Secondary | ICD-10-CM | POA: Insufficient documentation

## 2019-07-27 ENCOUNTER — Other Ambulatory Visit: Payer: Self-pay | Admitting: Nurse Practitioner

## 2019-07-27 ENCOUNTER — Other Ambulatory Visit: Payer: Self-pay | Admitting: Family Medicine

## 2019-07-27 DIAGNOSIS — Z1231 Encounter for screening mammogram for malignant neoplasm of breast: Secondary | ICD-10-CM

## 2019-08-09 DIAGNOSIS — R569 Unspecified convulsions: Secondary | ICD-10-CM | POA: Insufficient documentation

## 2019-08-09 DIAGNOSIS — Z87898 Personal history of other specified conditions: Secondary | ICD-10-CM | POA: Insufficient documentation

## 2019-12-29 DIAGNOSIS — R200 Anesthesia of skin: Secondary | ICD-10-CM | POA: Insufficient documentation

## 2020-01-31 ENCOUNTER — Emergency Department
Admission: EM | Admit: 2020-01-31 | Discharge: 2020-01-31 | Disposition: A | Discharge status: Home / Self Care | Payer: BLUE CROSS/BLUE SHIELD | Attending: Emergency Medicine | Admitting: Emergency Medicine

## 2020-01-31 ENCOUNTER — Other Ambulatory Visit: Payer: Self-pay

## 2020-01-31 ENCOUNTER — Emergency Department: Payer: BLUE CROSS/BLUE SHIELD

## 2020-01-31 DIAGNOSIS — S80911A Unspecified superficial injury of right knee, initial encounter: Secondary | ICD-10-CM | POA: Diagnosis present

## 2020-01-31 DIAGNOSIS — J45909 Unspecified asthma, uncomplicated: Secondary | ICD-10-CM | POA: Insufficient documentation

## 2020-01-31 DIAGNOSIS — X509XXA Other and unspecified overexertion or strenuous movements or postures, initial encounter: Secondary | ICD-10-CM | POA: Diagnosis not present

## 2020-01-31 DIAGNOSIS — Z7951 Long term (current) use of inhaled steroids: Secondary | ICD-10-CM | POA: Insufficient documentation

## 2020-01-31 DIAGNOSIS — Z79899 Other long term (current) drug therapy: Secondary | ICD-10-CM | POA: Insufficient documentation

## 2020-01-31 DIAGNOSIS — S83401A Sprain of unspecified collateral ligament of right knee, initial encounter: Secondary | ICD-10-CM

## 2020-01-31 MED ORDER — TRAMADOL HCL 50 MG PO TABS: 50.0000 mg | ORAL_TABLET | Freq: Four times a day (QID) | ORAL | 0 refills | Status: AC | PRN

## 2020-01-31 MED ORDER — NAPROXEN 375 MG PO TABS: 375.0000 mg | ORAL_TABLET | Freq: Two times a day (BID) | ORAL | 0 refills | Status: DC

## 2020-01-31 MED ORDER — NAPROXEN 500 MG PO TABS
500.0000 mg | ORAL_TABLET | Freq: Once | ORAL | Status: AC
  Administered 2020-01-31: 500 mg via ORAL
  Filled 2020-01-31: qty 1

## 2020-01-31 NOTE — Discharge Instructions (Signed)
Follow discharge care instruction.  Wear knee support while awake for the next 3 to 5 days.  Take medication as directed.
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
## 2020-02-07 ENCOUNTER — Other Ambulatory Visit (HOSPITAL_COMMUNITY): Payer: Self-pay | Admitting: Orthopedic Surgery

## 2020-02-07 ENCOUNTER — Other Ambulatory Visit: Payer: Self-pay | Admitting: Orthopedic Surgery

## 2020-02-12 ENCOUNTER — Other Ambulatory Visit: Payer: Self-pay | Admitting: Orthopedic Surgery

## 2020-02-12 DIAGNOSIS — M25562 Pain in left knee: Secondary | ICD-10-CM

## 2020-04-01 ENCOUNTER — Emergency Department
Admission: EM | Admit: 2020-04-01 | Discharge: 2020-04-01 | Disposition: A | Discharge status: Home / Self Care | Payer: Self-pay | Attending: Emergency Medicine | Admitting: Emergency Medicine

## 2020-04-01 ENCOUNTER — Other Ambulatory Visit: Payer: Self-pay

## 2020-04-01 ENCOUNTER — Emergency Department: Payer: Self-pay

## 2020-04-01 DIAGNOSIS — Z20822 Contact with and (suspected) exposure to covid-19: Secondary | ICD-10-CM | POA: Insufficient documentation

## 2020-04-01 DIAGNOSIS — J45909 Unspecified asthma, uncomplicated: Secondary | ICD-10-CM | POA: Insufficient documentation

## 2020-04-01 DIAGNOSIS — R079 Chest pain, unspecified: Secondary | ICD-10-CM

## 2020-04-01 LAB — RESP PANEL BY RT-PCR (FLU A&B, COVID) ARPGX2
Influenza A by PCR: NEGATIVE
Influenza B by PCR: NEGATIVE
SARS Coronavirus 2 by RT PCR: NEGATIVE

## 2020-04-01 LAB — CBC
HCT: 37.2 % (ref 36.0–46.0)
Hemoglobin: 12.3 g/dL (ref 12.0–15.0)
MCH: 29 pg (ref 26.0–34.0)
MCHC: 33.1 g/dL (ref 30.0–36.0)
MCV: 87.7 fL (ref 80.0–100.0)
Platelets: 131 10*3/uL — ABNORMAL LOW (ref 150–400)
RBC: 4.24 MIL/uL (ref 3.87–5.11)
RDW: 13.5 % (ref 11.5–15.5)
WBC: 3.8 10*3/uL — ABNORMAL LOW (ref 4.0–10.5)
nRBC: 0 % (ref 0.0–0.2)

## 2020-04-01 LAB — BASIC METABOLIC PANEL
Anion gap: 8 (ref 5–15)
BUN: 9 mg/dL (ref 6–20)
CO2: 24 mmol/L (ref 22–32)
Calcium: 9 mg/dL (ref 8.9–10.3)
Chloride: 103 mmol/L (ref 98–111)
Creatinine, Ser: 0.77 mg/dL (ref 0.44–1.00)
GFR, Estimated: >60 mL/min (ref >60)
Glucose, Bld: 94 mg/dL (ref 70–99)
Potassium: 3.8 mmol/L (ref 3.5–5.1)
Sodium: 135 mmol/L (ref 135–145)

## 2020-04-01 LAB — TROPONIN I (HIGH SENSITIVITY)
Troponin I (High Sensitivity): <2 ng/L (ref <18)
Troponin I (High Sensitivity): <2 ng/L (ref <18)

## 2020-04-01 LAB — POC SARS CORONAVIRUS 2 AG -  ED: SARS Coronavirus 2 Ag: NEGATIVE

## 2020-04-01 MED ORDER — LACTATED RINGERS IV BOLUS: 1000.0000 mL | Freq: Once | INTRAVENOUS | Status: DC

## 2020-04-01 MED ORDER — KETOROLAC TROMETHAMINE 30 MG/ML IJ SOLN
30.0000 mg | Freq: Once | INTRAMUSCULAR | Status: AC
  Administered 2020-04-01: 30 mg via INTRAMUSCULAR
  Filled 2020-04-01: qty 1

## 2020-04-01 MED ORDER — KETOROLAC TROMETHAMINE 30 MG/ML IJ SOLN: 15.0000 mg | Freq: Once | INTRAMUSCULAR | Status: DC

## 2020-04-01 MED ORDER — ACETAMINOPHEN 325 MG PO TABS
650.0000 mg | ORAL_TABLET | Freq: Once | ORAL | Status: AC
  Administered 2020-04-01: 650 mg via ORAL

## 2020-04-01 MED ORDER — ACETAMINOPHEN 325 MG PO TABS
ORAL_TABLET | ORAL | Status: AC
  Filled 2020-04-01: qty 2

## 2020-04-01 NOTE — ED Provider Notes (Signed)
[facility] Emergency Department Provider Note   ____________________________________________   Event Date/Time   First MD Initiated Contact with Patient 04/01/20 2004     (approximate)  I have reviewed the triage vital signs and the nursing notes.   HISTORY  Chief Complaint Chest Pain, Cough, and Fever    HPI Melanie Mccann is a 44 y.o. female with past medical history of asthma who presents to the ED complaining of chest pain and fever.  Patient reports that she was exposed to someone with COVID-19 on December 24.  Earlier this afternoon she developed heaviness in the center of her chest along with nonproductive cough, body aches, sore throat, and fevers.  Pain in her chest is constant and not exacerbated or alleviated by anything.  She denies any difficulty breathing, has been using her rescue inhaler at home with some relief.  She is unvaccinated against COVID-19.        Past Medical History:  Diagnosis Date  . Asthma   . Epilepsy (HCC)     There are no problems to display for this patient.   Past Surgical History:  Procedure Laterality Date  . KNEE SURGERY Right   . ORBITAL FRACTURE SURGERY Left     Prior to Admission medications   Medication Sig Start Date End Date Taking? Authorizing Provider  acyclovir (ZOVIRAX) 400 MG tablet Take 1 tablet (400 mg total) by mouth 5 (five) times daily. 04/23/19   Joni Reining, PA-C  albuterol (VENTOLIN HFA) 108 (90 Base) MCG/ACT inhaler Inhale 2 puffs into the lungs every 6 (six) hours as needed for wheezing or shortness of breath. 03/01/19   Miguel Aschoff., MD  amoxicillin (AMOXIL) 875 MG tablet Take 1 tablet (875 mg total) by mouth 2 (two) times daily. 04/23/19   Joni Reining, PA-C  azithromycin (ZITHROMAX Z-PAK) 250 MG tablet Take 2 tablets (500 mg) on  Day 1,  followed by 1 tablet (250 mg) once daily on Days 2 through 5. 07/25/19   Shaune Pollack, MD  cetirizine (ZYRTEC ALLERGY) 10 MG tablet Take 1  tablet (10 mg total) by mouth daily for 7 days. 07/01/19 07/08/19  Miguel Aschoff., MD  famotidine (PEPCID) 20 MG tablet Take 1 tablet (20 mg total) by mouth 2 (two) times daily for 7 days. 07/01/19 07/08/19  Miguel Aschoff., MD  fexofenadine-pseudoephedrine (ALLEGRA-D) 60-120 MG 12 hr tablet Take 1 tablet by mouth 2 (two) times daily. 04/23/19   Joni Reining, PA-C  ketorolac (TORADOL) 10 MG tablet Take 1 tablet (10 mg total) by mouth every 6 (six) hours as needed for moderate pain. 04/24/19   Sharman Cheek, MD  naproxen (NAPROSYN) 375 MG tablet Take 1 tablet (375 mg total) by mouth 2 (two) times daily with a meal. 01/31/20   Joni Reining, PA-C  topiramate (TOPAMAX) 100 MG tablet Take 100 mg by mouth daily. Take at night    [provider]  topiramate (TOPAMAX) 50 MG tablet Take 50 mg by mouth daily.    [provider]  traMADol (ULTRAM) 50 MG tablet Take 1 tablet (50 mg total) by mouth every 6 (six) hours as needed. 04/23/19 04/22/20  Joni Reining, PA-C    Allergies Shellfish allergy, Amoxicillin, Betadine [povidone iodine], and Iodine  No family history on file.  Social History Social History   Tobacco Use  . Smoking status: Never Smoker  . Smokeless tobacco: Never Used  Substance Use Topics  . Alcohol use: Yes  Comment: seldom  . Drug use: Never    Review of Systems  Constitutional: Positive for fever/chills Eyes: No visual changes. ENT: No sore throat. Cardiovascular: Positive for chest pain. Respiratory: Denies shortness of breath.  Positive for cough. Gastrointestinal: No abdominal pain.  No nausea, no vomiting.  No diarrhea.  No constipation. Genitourinary: Negative for dysuria. Musculoskeletal: Negative for back pain.  Positive for myalgias. Skin: Negative for rash. Neurological: Negative for headaches, focal weakness or numbness.  ____________________________________________   PHYSICAL EXAM:  VITAL SIGNS: ED Triage Vitals  Enc Vitals  Group     BP 04/01/20 1514 122/84     Pulse Rate 04/01/20 1514 94     Resp 04/01/20 1514 19     Temp 04/01/20 1514 100.2 F (37.9 C)     Temp Source 04/01/20 1514 Oral     SpO2 04/01/20 1514 100 %     Weight 04/01/20 1514 173 lb (78.5 kg)     Height 04/01/20 1514 5\' 11"  (1.803 m)     Head Circumference --      Peak Flow --      Pain Score 04/01/20 1528 10     Pain Loc --      Pain Edu? --      Excl. in GC? --     Constitutional: Alert and oriented. Eyes: Conjunctivae are normal. Head: Atraumatic. Nose: No congestion/rhinnorhea. Mouth/Throat: Mucous membranes are moist. Neck: Normal ROM Cardiovascular: Normal rate, regular rhythm. Grossly normal heart sounds.  2+ radial pulses bilaterally. Respiratory: Normal respiratory effort.  No retractions. Lungs CTAB. Gastrointestinal: Soft and nontender. No distention. Genitourinary: deferred Musculoskeletal: No lower extremity tenderness nor edema. Neurologic:  Normal speech and language. No gross focal neurologic deficits are appreciated. Skin:  Skin is warm, dry and intact. No rash noted. Psychiatric: Mood and affect are normal. Speech and behavior are normal.  ____________________________________________   LABS (all labs ordered are listed, but only abnormal results are displayed)  Labs Reviewed  CBC - Abnormal; Notable for the following components:      Result Value   WBC 3.8 (*)    Platelets 131 (*)    All other components within normal limits  RESP PANEL BY RT-PCR (FLU A&B, COVID) ARPGX2  BASIC METABOLIC PANEL  POC SARS CORONAVIRUS 2 AG -  ED  POC URINE PREG, ED  TROPONIN I (HIGH SENSITIVITY)  TROPONIN I (HIGH SENSITIVITY)   ____________________________________________  EKG  ED ECG REPORT I, 04/03/20, the attending physician, personally viewed and interpreted this ECG.   Date: 04/01/2020  EKG Time: 15:19  Rate: 103  Rhythm: sinus tachycardia  Axis: Normal  Intervals:none  ST&T Change:  None   PROCEDURES  Procedure(s) performed (including Critical Care):  Procedures   ____________________________________________   INITIAL IMPRESSION / ASSESSMENT AND PLAN / ED COURSE       44 year old female with past medical history of asthma who presents to the ED complaining of fever, cough, chest pain, and body aches after being exposed to COVID-19 4 days ago.  She is not in any respiratory distress and is maintaining O2 sats on room air.  Chest x-ray reviewed by me and shows no infiltrate, edema, or effusion.  EKG shows no evidence of arrhythmia or ischemia and 2 sets of troponin are negative.  I doubt ACS or PE at this time.  Symptoms seem most likely related to COVID-19, rapid testing is negative and we will now send PCR.  She was given IM Toradol for muscle aches  and is appropriate for discharge home.  She was counseled to follow-up with her PCP and otherwise return to the ED for new worsening symptoms, patient agrees with plan.      ____________________________________________   FINAL CLINICAL IMPRESSION(S) / ED DIAGNOSES  Final diagnoses:  Suspected COVID-19 virus infection  Chest pain, unspecified type     ED Discharge Orders    None       Note:  This document was prepared using Dragon voice recognition software and may include unintentional dictation errors.   Chesley Noon, MD 04/01/20 [phone]

## 2020-04-01 NOTE — ED Triage Notes (Signed)
Reports chest pain to center of chest described as heaviness that started at [facility] today. Pt states cough, body aches, sore throat, fever and chills that began last night. Recent exposure to COVID

## 2020-09-08 ENCOUNTER — Other Ambulatory Visit: Payer: Self-pay

## 2020-09-08 ENCOUNTER — Emergency Department
Admission: EM | Admit: 2020-09-08 | Discharge: 2020-09-08 | Disposition: A | Discharge status: Home / Self Care | Payer: Commercial Managed Care - PPO | Attending: Emergency Medicine | Admitting: Emergency Medicine

## 2020-09-08 ENCOUNTER — Encounter: Payer: Self-pay | Admitting: Emergency Medicine

## 2020-09-08 DIAGNOSIS — J45909 Unspecified asthma, uncomplicated: Secondary | ICD-10-CM | POA: Insufficient documentation

## 2020-09-08 DIAGNOSIS — R21 Rash and other nonspecific skin eruption: Secondary | ICD-10-CM | POA: Diagnosis present

## 2020-09-08 DIAGNOSIS — L239 Allergic contact dermatitis, unspecified cause: Secondary | ICD-10-CM | POA: Diagnosis not present

## 2020-09-08 MED ORDER — METHYLPREDNISOLONE 4 MG PO TBPK: ORAL_TABLET | ORAL | 0 refills | Status: AC

## 2020-09-08 MED ORDER — HYDROXYZINE HCL 50 MG PO TABS: 50.0000 mg | ORAL_TABLET | Freq: Three times a day (TID) | ORAL | 0 refills | Status: AC | PRN

## 2020-09-08 MED ORDER — METHYLPREDNISOLONE SODIUM SUCC 125 MG IJ SOLR
125.0000 mg | Freq: Once | INTRAMUSCULAR | Status: AC
  Administered 2020-09-08: 125 mg via INTRAMUSCULAR
  Filled 2020-09-08: qty 2

## 2020-09-08 MED ORDER — HYDROXYZINE HCL 50 MG PO TABS
50.0000 mg | ORAL_TABLET | Freq: Once | ORAL | Status: AC
  Administered 2020-09-08: 50 mg via ORAL
  Filled 2020-09-08: qty 1

## 2020-09-08 NOTE — ED Notes (Signed)
See triage note  Presents with possible allergic rxn to back of legs  States unsure of what she came in contact with     Went to [place] this weekend  Unsure if she sat in something  Positive itching

## 2020-09-08 NOTE — ED Provider Notes (Signed)
[facility] Emergency Department Provider Note   ____________________________________________   Event Date/Time   First MD Initiated Contact with Patient 09/08/20 0710     (approximate)  I have reviewed the triage vital signs and the nursing notes.   HISTORY  Chief Complaint Rash    HPI Melanie Mccann is a 45 y.o. female patient presents with a rash to posterior right leg.  Patient states there is irritation to the posterior left leg but no lesions.  Patient states rash is itching and only mild relief with taking Benadryl.         Past Medical History:  Diagnosis Date  . Asthma   . Epilepsy (HCC)     There are no problems to display for this patient.   Past Surgical History:  Procedure Laterality Date  . KNEE SURGERY Right   . ORBITAL FRACTURE SURGERY Left     Prior to Admission medications   Medication Sig Start Date End Date Taking? Authorizing Provider  hydrOXYzine (ATARAX/VISTARIL) 50 MG tablet Take 1 tablet (50 mg total) by mouth 3 (three) times daily as needed for itching. 09/08/20  Yes Joni Reining, PA-C  methylPREDNISolone (MEDROL DOSEPAK) 4 MG TBPK tablet Take Tapered dose as directed 09/08/20  Yes Joni Reining, PA-C  acyclovir (ZOVIRAX) 400 MG tablet Take 1 tablet (400 mg total) by mouth 5 (five) times daily. 04/23/19   Joni Reining, PA-C  albuterol (VENTOLIN HFA) 108 (90 Base) MCG/ACT inhaler Inhale 2 puffs into the lungs every 6 (six) hours as needed for wheezing or shortness of breath. 03/01/19   Miguel Aschoff., MD  cetirizine (ZYRTEC ALLERGY) 10 MG tablet Take 1 tablet (10 mg total) by mouth daily for 7 days. 07/01/19 07/08/19  Miguel Aschoff., MD  famotidine (PEPCID) 20 MG tablet Take 1 tablet (20 mg total) by mouth 2 (two) times daily for 7 days. 07/01/19 07/08/19  Miguel Aschoff., MD  fexofenadine-pseudoephedrine (ALLEGRA-D) 60-120 MG 12 hr tablet Take 1 tablet by mouth 2 (two) times daily. 04/23/19   Joni Reining, PA-C   topiramate (TOPAMAX) 100 MG tablet Take 100 mg by mouth daily. Take at night    [provider]  topiramate (TOPAMAX) 50 MG tablet Take 50 mg by mouth daily.    [provider]    Allergies Iodine, Povidone-iodine, Shellfish allergy, Amoxicillin, and Betadine [povidone iodine]  History reviewed. No pertinent family history.  Social History Social History   Tobacco Use  . Smoking status: Never Smoker  . Smokeless tobacco: Never Used  Substance Use Topics  . Alcohol use: Yes    Comment: seldom  . Drug use: Never    Review of Systems Constitutional: No fever/chills Eyes: No visual changes. ENT: No sore throat. Cardiovascular: Denies chest pain. Respiratory: Denies shortness of breath. Gastrointestinal: No abdominal pain.  No nausea, no vomiting.  No diarrhea.  No constipation. Genitourinary: Negative for dysuria. Musculoskeletal: Negative for back pain. Skin: Positive for rash. Neurological: Negative for headaches, focal weakness or numbness. Allergic/Immunilogical: Iodine, shellfish, amoxicillin, and Betadine. ____________________________________________   PHYSICAL EXAM:  VITAL SIGNS: ED Triage Vitals  Enc Vitals Group     BP 09/08/20 0652 120/75     Pulse Rate 09/08/20 0652 90     Resp 09/08/20 0652 18     Temp 09/08/20 0652 98.9 F (37.2 C)     Temp Source 09/08/20 0652 Oral     SpO2 09/08/20 0652 100 %     Weight  09/08/20 0719 173 lb 1 oz (78.5 kg)     Height 09/08/20 0719 5\' 11"  (1.803 m)     Head Circumference --      Peak Flow --      Pain Score 09/08/20 0649 0     Pain Loc --      Pain Edu? --      Excl. in GC? --     Constitutional: Alert and oriented. Well appearing and in no acute distress. Cardiovascular: Normal rate, regular rhythm. Grossly normal heart sounds.  Good peripheral circulation. Respiratory: Normal respiratory effort.  No retractions. Lungs CTAB. Skin:  Skin is warm, dry and intact.  Vesicle lesions posterior right  thigh Psychiatric: Mood and affect are normal. Speech and behavior are normal.  ____________________________________________   LABS (all labs ordered are listed, but only abnormal results are displayed)  Labs Reviewed - No data to display ____________________________________________  EKG   ____________________________________________  RADIOLOGY I, 11/08/20, personally viewed and evaluated these images (plain radiographs) as part of my medical decision making, as well as reviewing the written report by the radiologist.  ED MD interpretation:    Official radiology report(s): No results found.  ____________________________________________   PROCEDURES  Procedure(s) performed (including Critical Care):  Procedures   ____________________________________________   INITIAL IMPRESSION / ASSESSMENT AND PLAN / ED COURSE  As part of my medical decision making, I reviewed the following data within the electronic MEDICAL RECORD NUMBER         Patient presents with vesicle lesions posterior right thigh.  Complaint and physical exam consistent with contact dermatitis.  Patient given discharge care instruction advised take medication as directed.  [facility]      ____________________________________________   FINAL CLINICAL IMPRESSION(S) / ED DIAGNOSES  Final diagnoses:  Allergic contact dermatitis, unspecified trigger     ED Discharge Orders         Ordered    methylPREDNISolone (MEDROL DOSEPAK) 4 MG TBPK tablet        09/08/20 0731    hydrOXYzine (ATARAX/VISTARIL) 50 MG tablet  3 times daily PRN        09/08/20 0731           Note:  This document was prepared using Dragon voice recognition software and may include unintentional dictation errors.    Elvis, Boot, PA-C 09/08/20 11/08/20    1093, MD 09/09/20 1049

## 2020-09-08 NOTE — Discharge Instructions (Signed)
Read and follow discharge care instructions.  Take medication as directed. 

## 2020-09-08 NOTE — ED Triage Notes (Signed)
Pt arrived via POV with reports of itching rash to back of both legs, states she has been taking benadryl with last dose last night as well as using benadryl gel.  Pt was recently at [place] prior to rash starting.

## 2020-09-19 ENCOUNTER — Encounter: Payer: Self-pay | Admitting: Emergency Medicine

## 2020-09-19 ENCOUNTER — Emergency Department
Admission: EM | Admit: 2020-09-19 | Discharge: 2020-09-19 | Disposition: A | Discharge status: Home / Self Care | Payer: Commercial Managed Care - PPO | Attending: Emergency Medicine | Admitting: Emergency Medicine

## 2020-09-19 ENCOUNTER — Other Ambulatory Visit: Payer: Self-pay

## 2020-09-19 DIAGNOSIS — J45909 Unspecified asthma, uncomplicated: Secondary | ICD-10-CM | POA: Insufficient documentation

## 2020-09-19 DIAGNOSIS — L02411 Cutaneous abscess of right axilla: Secondary | ICD-10-CM | POA: Insufficient documentation

## 2020-09-19 DIAGNOSIS — L02412 Cutaneous abscess of left axilla: Secondary | ICD-10-CM | POA: Diagnosis not present

## 2020-09-19 MED ORDER — HYDROXYZINE HCL 50 MG PO TABS: 50.0000 mg | ORAL_TABLET | Freq: Three times a day (TID) | ORAL | 0 refills | Status: AC | PRN

## 2020-09-19 MED ORDER — NAPROXEN 500 MG PO TABS: 500.0000 mg | ORAL_TABLET | Freq: Two times a day (BID) | ORAL | 0 refills | Status: AC

## 2020-09-19 MED ORDER — SULFAMETHOXAZOLE-TRIMETHOPRIM 800-160 MG PO TABS: 1.0000 | ORAL_TABLET | Freq: Two times a day (BID) | ORAL | 0 refills | Status: AC

## 2020-09-19 NOTE — ED Provider Notes (Signed)
[facility] Emergency Department Provider Note   ____________________________________________   Event Date/Time   First MD Initiated Contact with Patient 09/19/20 [phone]     (approximate)  I have reviewed the triage vital signs and the nursing notes.   HISTORY  Chief Complaint Abscess    HPI Melanie Mccann is a 45 y.o. female patient presents with nodular lesions bilateral axillary area for 1 week.  Patient denies drainage.  Patient has a history of eczema and has been scratching the areas secondary to pruritus.  Rates her pain/discomfort is 10/10.  No relief over-the-counter medications.         Past Medical History:  Diagnosis Date   Asthma    Epilepsy (HCC)     There are no problems to display for this patient.   Past Surgical History:  Procedure Laterality Date   KNEE SURGERY Right    ORBITAL FRACTURE SURGERY Left     Prior to Admission medications   Medication Sig Start Date End Date Taking? Authorizing Provider  hydrOXYzine (ATARAX/VISTARIL) 50 MG tablet Take 1 tablet (50 mg total) by mouth 3 (three) times daily as needed. 09/19/20  Yes Joni Reining, PA-C  naproxen (NAPROSYN) 500 MG tablet Take 1 tablet (500 mg total) by mouth 2 (two) times daily with a meal. 09/19/20  Yes Joni Reining, PA-C  sulfamethoxazole-trimethoprim (BACTRIM DS) 800-160 MG tablet Take 1 tablet by mouth 2 (two) times daily. 09/19/20  Yes Joni Reining, PA-C  acyclovir (ZOVIRAX) 400 MG tablet Take 1 tablet (400 mg total) by mouth 5 (five) times daily. 04/23/19   Joni Reining, PA-C  albuterol (VENTOLIN HFA) 108 (90 Base) MCG/ACT inhaler Inhale 2 puffs into the lungs every 6 (six) hours as needed for wheezing or shortness of breath. 03/01/19   Miguel Aschoff., MD  cetirizine (ZYRTEC ALLERGY) 10 MG tablet Take 1 tablet (10 mg total) by mouth daily for 7 days. 07/01/19 07/08/19  Miguel Aschoff., MD  famotidine (PEPCID) 20 MG tablet Take 1 tablet (20 mg total) by mouth 2  (two) times daily for 7 days. 07/01/19 07/08/19  Miguel Aschoff., MD  fexofenadine-pseudoephedrine (ALLEGRA-D) 60-120 MG 12 hr tablet Take 1 tablet by mouth 2 (two) times daily. 04/23/19   Joni Reining, PA-C  hydrOXYzine (ATARAX/VISTARIL) 50 MG tablet Take 1 tablet (50 mg total) by mouth 3 (three) times daily as needed for itching. 09/08/20   Joni Reining, PA-C  methylPREDNISolone (MEDROL DOSEPAK) 4 MG TBPK tablet Take Tapered dose as directed 09/08/20   Joni Reining, PA-C  topiramate (TOPAMAX) 100 MG tablet Take 100 mg by mouth daily. Take at night    [provider]  topiramate (TOPAMAX) 50 MG tablet Take 50 mg by mouth daily.    [provider]    Allergies Iodine, Povidone-iodine, Shellfish allergy, Amoxicillin, and Betadine [povidone iodine]  No family history on file.  Social History Social History   Tobacco Use   Smoking status: Never   Smokeless tobacco: Never  Vaping Use   Vaping Use: Never used  Substance Use Topics   Alcohol use: Yes    Comment: seldom   Drug use: Never    Review of Systems  Constitutional: No fever/chills Eyes: No visual changes. ENT: No sore throat. Cardiovascular: Denies chest pain. Respiratory: Denies shortness of breath. Gastrointestinal: No abdominal pain.  No nausea, no vomiting.  No diarrhea.  No constipation. Genitourinary: Negative for dysuria. Musculoskeletal: Negative for back pain. Skin:  Positive for rash. Neurological: Negative for headaches, focal weakness or numbness. Allergic/Immunilogical: Amoxil, iodine, shellfish.  ____________________________________________   PHYSICAL EXAM:  VITAL SIGNS: ED Triage Vitals  Enc Vitals Group     BP 09/19/20 0623 121/89     Pulse Rate 09/19/20 0623 (!) 103     Resp 09/19/20 0623 18     Temp 09/19/20 0623 99.5 F (37.5 C)     Temp Source 09/19/20 0623 Oral     SpO2 09/19/20 0623 99 %     Weight 09/19/20 0624 175 lb (79.4 kg)     Height 09/19/20 0624 6' (1.829 m)      Head Circumference --      Peak Flow --      Pain Score 09/19/20 0624 10     Pain Loc --      Pain Edu? --      Excl. in GC? --     Constitutional: Alert and oriented. Well appearing and in no acute distress. Cardiovascular: Normal rate, regular rhythm. Grossly normal heart sounds.  Good peripheral circulation. Respiratory: Normal respiratory effort.  No retractions. Lungs CTAB. Genitourinary: Deferred Musculoskeletal: No lower extremity tenderness nor edema.  No joint effusions. Neurologic:  Normal speech and language. No gross focal neurologic deficits are appreciated. No gait instability. Skin:  Skin is warm, dry and intact.  Nonfluctuant nodule lesions on erythematous base bilateral axillary area  psychiatric: Mood and affect are normal. Speech and behavior are normal.  ____________________________________________   LABS (all labs ordered are listed, but only abnormal results are displayed)  Labs Reviewed - No data to display ____________________________________________  EKG   ____________________________________________  RADIOLOGY I, Joni Reining, personally viewed and evaluated these images (plain radiographs) as part of my medical decision making, as well as reviewing the written report by the radiologist.  ED MD interpretation:    Official radiology report(s): No results found.  ____________________________________________   PROCEDURES  Procedure(s) performed (including Critical Care):  Procedures   ____________________________________________   INITIAL IMPRESSION / ASSESSMENT AND PLAN / ED COURSE  As part of my medical decision making, I reviewed the following data within the electronic MEDICAL RECORD NUMBER         Patient presents with bilateral nonfluctuant nodule lesion bilateral axillary area.  Discussed rationale for not incised and drainage.  Patient given discharge care instructions and a prescription for Bactrim DS, naproxen, and Atarax.   Patient also advised to use antibacterial soap which was provided before discharge.  Patient [facility].     ____________________________________________   FINAL CLINICAL IMPRESSION(S) / ED DIAGNOSES  Final diagnoses:  Abscess of axilla, left  Abscess of axilla, right     ED Discharge Orders          Ordered    sulfamethoxazole-trimethoprim (BACTRIM DS) 800-160 MG tablet  2 times daily        09/19/20 0715    naproxen (NAPROSYN) 500 MG tablet  2 times daily with meals        09/19/20 0715    hydrOXYzine (ATARAX/VISTARIL) 50 MG tablet  3 times daily PRN        09/19/20 0715             Note:  This document was prepared using Dragon voice recognition software and may include unintentional dictation errors.    Icelyn, Navarrete, PA-C 09/19/20 7001    Jene Every, MD 09/19/20 [phone]

## 2020-09-19 NOTE — ED Notes (Signed)
See triage note  Presents with possible abscess area under both arms  States she has eczema and developed some itching  Area under left arm worse than right

## 2020-09-19 NOTE — ED Triage Notes (Signed)
Patient ambulatory to triage with steady gait, without difficulty or distress noted; pt reports abscess to axillae bilat x wk with no hx of same

## 2020-09-19 NOTE — Discharge Instructions (Addendum)
Read and follow discharge care instruction.  Take medication as directed. 

## 2020-12-03 ENCOUNTER — Encounter: Payer: Self-pay | Admitting: Emergency Medicine

## 2020-12-03 ENCOUNTER — Emergency Department
Admission: EM | Admit: 2020-12-03 | Discharge: 2020-12-03 | Disposition: A | Discharge status: Home / Self Care | Payer: Commercial Managed Care - PPO | Attending: Emergency Medicine | Admitting: Emergency Medicine

## 2020-12-03 ENCOUNTER — Other Ambulatory Visit: Payer: Self-pay

## 2020-12-03 DIAGNOSIS — J45909 Unspecified asthma, uncomplicated: Secondary | ICD-10-CM | POA: Diagnosis not present

## 2020-12-03 DIAGNOSIS — R112 Nausea with vomiting, unspecified: Secondary | ICD-10-CM | POA: Insufficient documentation

## 2020-12-03 DIAGNOSIS — R197 Diarrhea, unspecified: Secondary | ICD-10-CM | POA: Insufficient documentation

## 2020-12-03 DIAGNOSIS — R42 Dizziness and giddiness: Secondary | ICD-10-CM | POA: Insufficient documentation

## 2020-12-03 DIAGNOSIS — R531 Weakness: Secondary | ICD-10-CM | POA: Insufficient documentation

## 2020-12-03 HISTORY — DX: Nonrheumatic mitral (valve) prolapse: I34.1

## 2020-12-03 HISTORY — DX: Dermatitis, unspecified: L30.9

## 2020-12-03 HISTORY — DX: Zoster without complications: B02.9

## 2020-12-03 LAB — BASIC METABOLIC PANEL
Anion gap: 6 (ref 5–15)
BUN: 10 mg/dL (ref 6–20)
CO2: 20 mmol/L — ABNORMAL LOW (ref 22–32)
Calcium: 8.7 mg/dL — ABNORMAL LOW (ref 8.9–10.3)
Chloride: 107 mmol/L (ref 98–111)
Creatinine, Ser: 0.79 mg/dL (ref 0.44–1.00)
GFR, Estimated: >60 mL/min (ref >60)
Glucose, Bld: 101 mg/dL — ABNORMAL HIGH (ref 70–99)
Potassium: 3.8 mmol/L (ref 3.5–5.1)
Sodium: 133 mmol/L — ABNORMAL LOW (ref 135–145)

## 2020-12-03 LAB — CBC
HCT: 42.2 % (ref 36.0–46.0)
Hemoglobin: 14.2 g/dL (ref 12.0–15.0)
MCH: 29.5 pg (ref 26.0–34.0)
MCHC: 33.6 g/dL (ref 30.0–36.0)
MCV: 87.7 fL (ref 80.0–100.0)
Platelets: 157 10*3/uL (ref 150–400)
RBC: 4.81 MIL/uL (ref 3.87–5.11)
RDW: 13.5 % (ref 11.5–15.5)
WBC: 2.1 10*3/uL — ABNORMAL LOW (ref 4.0–10.5)
nRBC: 0 % (ref 0.0–0.2)

## 2020-12-03 MED ORDER — ONDANSETRON 4 MG PO TBDP: 4.0000 mg | ORAL_TABLET | Freq: Three times a day (TID) | ORAL | 0 refills | Status: AC | PRN

## 2020-12-03 MED ORDER — LACTATED RINGERS IV BOLUS
1000.0000 mL | Freq: Once | INTRAVENOUS | Status: AC
  Administered 2020-12-03: 1000 mL via INTRAVENOUS

## 2020-12-03 MED ORDER — METOCLOPRAMIDE HCL 5 MG/ML IJ SOLN
10.0000 mg | Freq: Once | INTRAMUSCULAR | Status: AC
  Administered 2020-12-03: 10 mg via INTRAVENOUS
  Filled 2020-12-03: qty 2

## 2020-12-03 NOTE — ED Notes (Signed)
Patient stable and discharged with all personal belongings and AVS. AVS and discharge instructions reviewed with patient and opportunity for questions provided.   

## 2020-12-03 NOTE — ED Triage Notes (Signed)
Pt via POV from home. Pt states she thinks she had food poisoning pt unable to keep food down since Monday morning. Pt c/o diarrhea, nausea, lightheadness, and weakness. Pt also c/o headache. Pt is A&OX4 and NAD.

## 2020-12-03 NOTE — Discharge Instructions (Addendum)
Please start with clear liquid diet and progress to bland diet as tolerated.  Follow-up with primary care or return to the emergency department for symptoms of concern.

## 2020-12-03 NOTE — ED Provider Notes (Signed)
[facility] Emergency Department Provider Note ____________________________________________   Event Date/Time   First MD Initiated Contact with Patient 12/03/20 1943     (approximate)  I have reviewed the triage vital signs and the nursing notes.   HISTORY  Chief Complaint Weakness, Emesis, and Diarrhea  HPI Melanie Mccann is a 45 y.o. female with history of 2 days of nausea, vomiting, and diarrhea.  She wonders if she got some food poisoning from something that she ate on Sunday night.  Today, she has had a significant decrease in diarrhea and no vomiting.  She has been able to tolerate Gatorade but she still felt very weak and dizzy so she decided to come to the emergency department because she felt dehydrated.  She denies fever, cough, abdominal pain, or dysuria.          Past Medical History:  Diagnosis Date   Asthma    Eczema    Epilepsy (HCC)    Mitral valve prolapse    Shingles     There are no problems to display for this patient.   Past Surgical History:  Procedure Laterality Date   KNEE SURGERY Right    ORBITAL FRACTURE SURGERY Left     Prior to Admission medications   Medication Sig Start Date End Date Taking? Authorizing Provider  ondansetron (ZOFRAN-ODT) 4 MG disintegrating tablet Take 1 tablet (4 mg total) by mouth every 8 (eight) hours as needed for nausea or vomiting. 12/03/20  Yes Shepard Keltz B, FNP  acyclovir (ZOVIRAX) 400 MG tablet Take 1 tablet (400 mg total) by mouth 5 (five) times daily. 04/23/19   Joni Reining, PA-C  albuterol (VENTOLIN HFA) 108 (90 Base) MCG/ACT inhaler Inhale 2 puffs into the lungs every 6 (six) hours as needed for wheezing or shortness of breath. 03/01/19   Miguel Aschoff., MD  cetirizine (ZYRTEC ALLERGY) 10 MG tablet Take 1 tablet (10 mg total) by mouth daily for 7 days. 07/01/19 07/08/19  Miguel Aschoff., MD  famotidine (PEPCID) 20 MG tablet Take 1 tablet (20 mg total) by mouth 2 (two) times daily for 7  days. 07/01/19 07/08/19  Miguel Aschoff., MD  fexofenadine-pseudoephedrine (ALLEGRA-D) 60-120 MG 12 hr tablet Take 1 tablet by mouth 2 (two) times daily. 04/23/19   Joni Reining, PA-C  hydrOXYzine (ATARAX/VISTARIL) 50 MG tablet Take 1 tablet (50 mg total) by mouth 3 (three) times daily as needed for itching. 09/08/20   Joni Reining, PA-C  hydrOXYzine (ATARAX/VISTARIL) 50 MG tablet Take 1 tablet (50 mg total) by mouth 3 (three) times daily as needed. 09/19/20   Joni Reining, PA-C  methylPREDNISolone (MEDROL DOSEPAK) 4 MG TBPK tablet Take Tapered dose as directed 09/08/20   Joni Reining, PA-C  naproxen (NAPROSYN) 500 MG tablet Take 1 tablet (500 mg total) by mouth 2 (two) times daily with a meal. 09/19/20   Joni Reining, PA-C  sulfamethoxazole-trimethoprim (BACTRIM DS) 800-160 MG tablet Take 1 tablet by mouth 2 (two) times daily. 09/19/20   Joni Reining, PA-C  topiramate (TOPAMAX) 100 MG tablet Take 100 mg by mouth daily. Take at night    [provider]  topiramate (TOPAMAX) 50 MG tablet Take 50 mg by mouth daily.    [provider]    Allergies Iodine, Povidone-iodine, Shellfish allergy, Amoxicillin, and Betadine [povidone iodine]  History reviewed. No pertinent family history.  Social History Social History   Tobacco Use   Smoking status: Never  Smokeless tobacco: Never  Vaping Use   Vaping Use: Never used  Substance Use Topics   Alcohol use: Yes    Comment: seldom   Drug use: Never    Review of Systems  Constitutional: No fever/chills Eyes: No visual changes. ENT: No sore throat. Cardiovascular: Denies chest pain. Respiratory: Denies shortness of breath. Gastrointestinal: No abdominal pain.  Positive for nausea, vomiting, and diarrhea genitourinary: Negative for dysuria. Musculoskeletal: Negative for back pain. Skin: Negative for rash. Neurological: Negative for headaches, focal weakness or  numbness.  ____________________________________________   PHYSICAL EXAM:  VITAL SIGNS: ED Triage Vitals  Enc Vitals Group     BP 12/03/20 1741 119/85     Pulse Rate 12/03/20 1741 95     Resp 12/03/20 1741 20     Temp 12/03/20 1741 98.5 F (36.9 C)     Temp Source 12/03/20 1741 Oral     SpO2 12/03/20 1741 99 %     Weight 12/03/20 1742 175 lb (79.4 kg)     Height 12/03/20 1742 5\' 11"  (1.803 m)     Head Circumference --      Peak Flow --      Pain Score 12/03/20 1742 7     Pain Loc --      Pain Edu? --      Excl. in GC? --     Constitutional: Alert and oriented. Well appearing and in no acute distress. Eyes: Conjunctivae are normal.  Head: Atraumatic. Nose: No congestion/rhinnorhea. Mouth/Throat: Mucous membranes are moist.  Oropharynx non-erythematous. Neck: No stridor.   Hematological/Lymphatic/Immunilogical: No cervical lymphadenopathy. Cardiovascular: Normal rate, regular rhythm. Grossly normal heart sounds.  Good peripheral circulation. Respiratory: Normal respiratory effort.  No retractions. Lungs CTAB. Gastrointestinal: Soft and nontender. No distention. No abdominal bruits. Genitourinary:  Musculoskeletal: No lower extremity tenderness nor edema.  No joint effusions. Neurologic:  Normal speech and language. No gross focal neurologic deficits are appreciated. No gait instability. Skin:  Skin is warm, dry and intact. No rash noted. Psychiatric: Mood and affect are normal. Speech and behavior are normal.  ____________________________________________   LABS (all labs ordered are listed, but only abnormal results are displayed)  Labs Reviewed  BASIC METABOLIC PANEL - Abnormal; Notable for the following components:      Result Value   Sodium 133 (*)    CO2 20 (*)    Glucose, Bld 101 (*)    Calcium 8.7 (*)    All other components within normal limits  CBC - Abnormal; Notable for the following components:   WBC 2.1 (*)    All other components within normal limits   URINALYSIS, COMPLETE (UACMP) WITH MICROSCOPIC  CBG MONITORING, ED   ____________________________________________  EKG  Not indicated ____________________________________________  RADIOLOGY  ED MD interpretation:    Not indicated I, 12/05/20, personally viewed and evaluated these images (plain radiographs) as part of my medical decision making, as well as reviewing the written report by the radiologist.  Official radiology report(s): No results found.  ____________________________________________   PROCEDURES  Procedure(s) performed (including Critical Care):  Procedures  ____________________________________________   INITIAL IMPRESSION / ASSESSMENT AND PLAN     45 year old female presenting to the emergency department for treatment evaluation after a couple days of nausea, vomiting, and diarrhea without abdominal pain.  She has been able to tolerate fluids today.  See HPI for further details.  While awaiting ER room assignment, her labs were drawn and it does appear that she is slightly hyponatremic.  Plan  will be to give her a liter of lactated Ringer's and some Reglan.  DIFFERENTIAL DIAGNOSIS  Gastroenteritis, colitis, COVID-19, influenza  ED COURSE  While in the emergency department, she had no additional reports of vomiting or diarrhea.  Plan will be to discharge her home and encouraged her to start with a clear diet and progress to her normal diet as tolerated.  She will be advised to increase her fluid intake over the next few days.  She is to follow-up with primary care or return to the emergency department for symptoms of change or worsen.    ___________________________________________   FINAL CLINICAL IMPRESSION(S) / ED DIAGNOSES  Final diagnoses:  Nausea vomiting and diarrhea     ED Discharge Orders          Ordered    ondansetron (ZOFRAN-ODT) 4 MG disintegrating tablet  Every 8 hours PRN        12/03/20 2125             Naureen Benton was evaluated in Emergency Department on 12/03/2020 for the symptoms described in the history of present illness. She was evaluated in the context of the global COVID-19 pandemic, which necessitated consideration that the patient might be at risk for infection with the SARS-CoV-2 virus that causes COVID-19. Institutional protocols and algorithms that pertain to the evaluation of patients at risk for COVID-19 are in a state of rapid change based on information released by regulatory bodies including the CDC and federal and state organizations. These policies and algorithms were followed during the patient's care in the ED.   Note:  This document was prepared using Dragon voice recognition software and may include unintentional dictation errors.    Chinita Pester, FNP 12/03/20 2131    Gilles Chiquito, MD 12/03/20 2229

## 2021-01-28 ENCOUNTER — Encounter: Payer: Self-pay | Admitting: Emergency Medicine

## 2021-01-28 ENCOUNTER — Other Ambulatory Visit: Payer: Self-pay

## 2021-01-28 ENCOUNTER — Emergency Department
Admission: EM | Admit: 2021-01-28 | Discharge: 2021-01-28 | Disposition: A | Discharge status: Home / Self Care | Payer: Commercial Managed Care - PPO | Attending: Emergency Medicine | Admitting: Emergency Medicine

## 2021-01-28 ENCOUNTER — Emergency Department: Payer: Commercial Managed Care - PPO

## 2021-01-28 ENCOUNTER — Ambulatory Visit: Admit: 2021-01-28 | Discharge status: Absent without leave | Payer: Commercial Managed Care - PPO

## 2021-01-28 DIAGNOSIS — R0789 Other chest pain: Secondary | ICD-10-CM | POA: Insufficient documentation

## 2021-01-28 DIAGNOSIS — Z5321 Procedure and treatment not carried out due to patient leaving prior to being seen by health care provider: Secondary | ICD-10-CM | POA: Insufficient documentation

## 2021-01-28 LAB — CBC
HCT: 33.9 % — ABNORMAL LOW (ref 36.0–46.0)
Hemoglobin: 11.6 g/dL — ABNORMAL LOW (ref 12.0–15.0)
MCH: 29.9 pg (ref 26.0–34.0)
MCHC: 34.2 g/dL (ref 30.0–36.0)
MCV: 87.4 fL (ref 80.0–100.0)
Platelets: 160 10*3/uL (ref 150–400)
RBC: 3.88 MIL/uL (ref 3.87–5.11)
RDW: 13.6 % (ref 11.5–15.5)
WBC: 5.7 10*3/uL (ref 4.0–10.5)
nRBC: 0 % (ref 0.0–0.2)

## 2021-01-28 LAB — BASIC METABOLIC PANEL
Anion gap: 3 — ABNORMAL LOW (ref 5–15)
BUN: 9 mg/dL (ref 6–20)
CO2: 25 mmol/L (ref 22–32)
Calcium: 8.5 mg/dL — ABNORMAL LOW (ref 8.9–10.3)
Chloride: 107 mmol/L (ref 98–111)
Creatinine, Ser: 0.71 mg/dL (ref 0.44–1.00)
GFR, Estimated: >60 mL/min (ref >60)
Glucose, Bld: 100 mg/dL — ABNORMAL HIGH (ref 70–99)
Potassium: 3.5 mmol/L (ref 3.5–5.1)
Sodium: 135 mmol/L (ref 135–145)

## 2021-01-28 LAB — TROPONIN I (HIGH SENSITIVITY)
Troponin I (High Sensitivity): <2 ng/L (ref <18)
Troponin I (High Sensitivity): <2 ng/L (ref <18)

## 2021-01-28 NOTE — ED Triage Notes (Signed)
Patient ambulatory to triage with steady gait, without difficulty or distress noted; pt reports mid CP radiating into back accomp by dizziness

## 2021-01-28 NOTE — ED Provider Notes (Signed)
Assigned myself but patient left without being seen from the waiting room.  I did review his EKG which is nonischemic.  Troponins negative x2.   Shaune Pollack, MD 01/28/21 1155

## 2021-01-28 NOTE — ED Notes (Signed)
Pt called in lobby at 0715 without response. Pt called again in lobby at 0745 without response. RN attempted to call pt via phone without answer.

## 2021-03-21 ENCOUNTER — Encounter: Payer: Self-pay | Admitting: Family

## 2021-04-14 ENCOUNTER — Other Ambulatory Visit: Payer: Self-pay

## 2021-04-15 ENCOUNTER — Ambulatory Visit: Payer: Commercial Managed Care - PPO | Admitting: Gastroenterology

## 2022-12-28 IMAGING — CR DG CHEST 2V
2 series · 2 of 2 positions shown · non-contrast
Comparison: April 01, 2020

CLINICAL DATA: Chest pain.

EXAM:
CHEST - 2 VIEW

[chest pa]
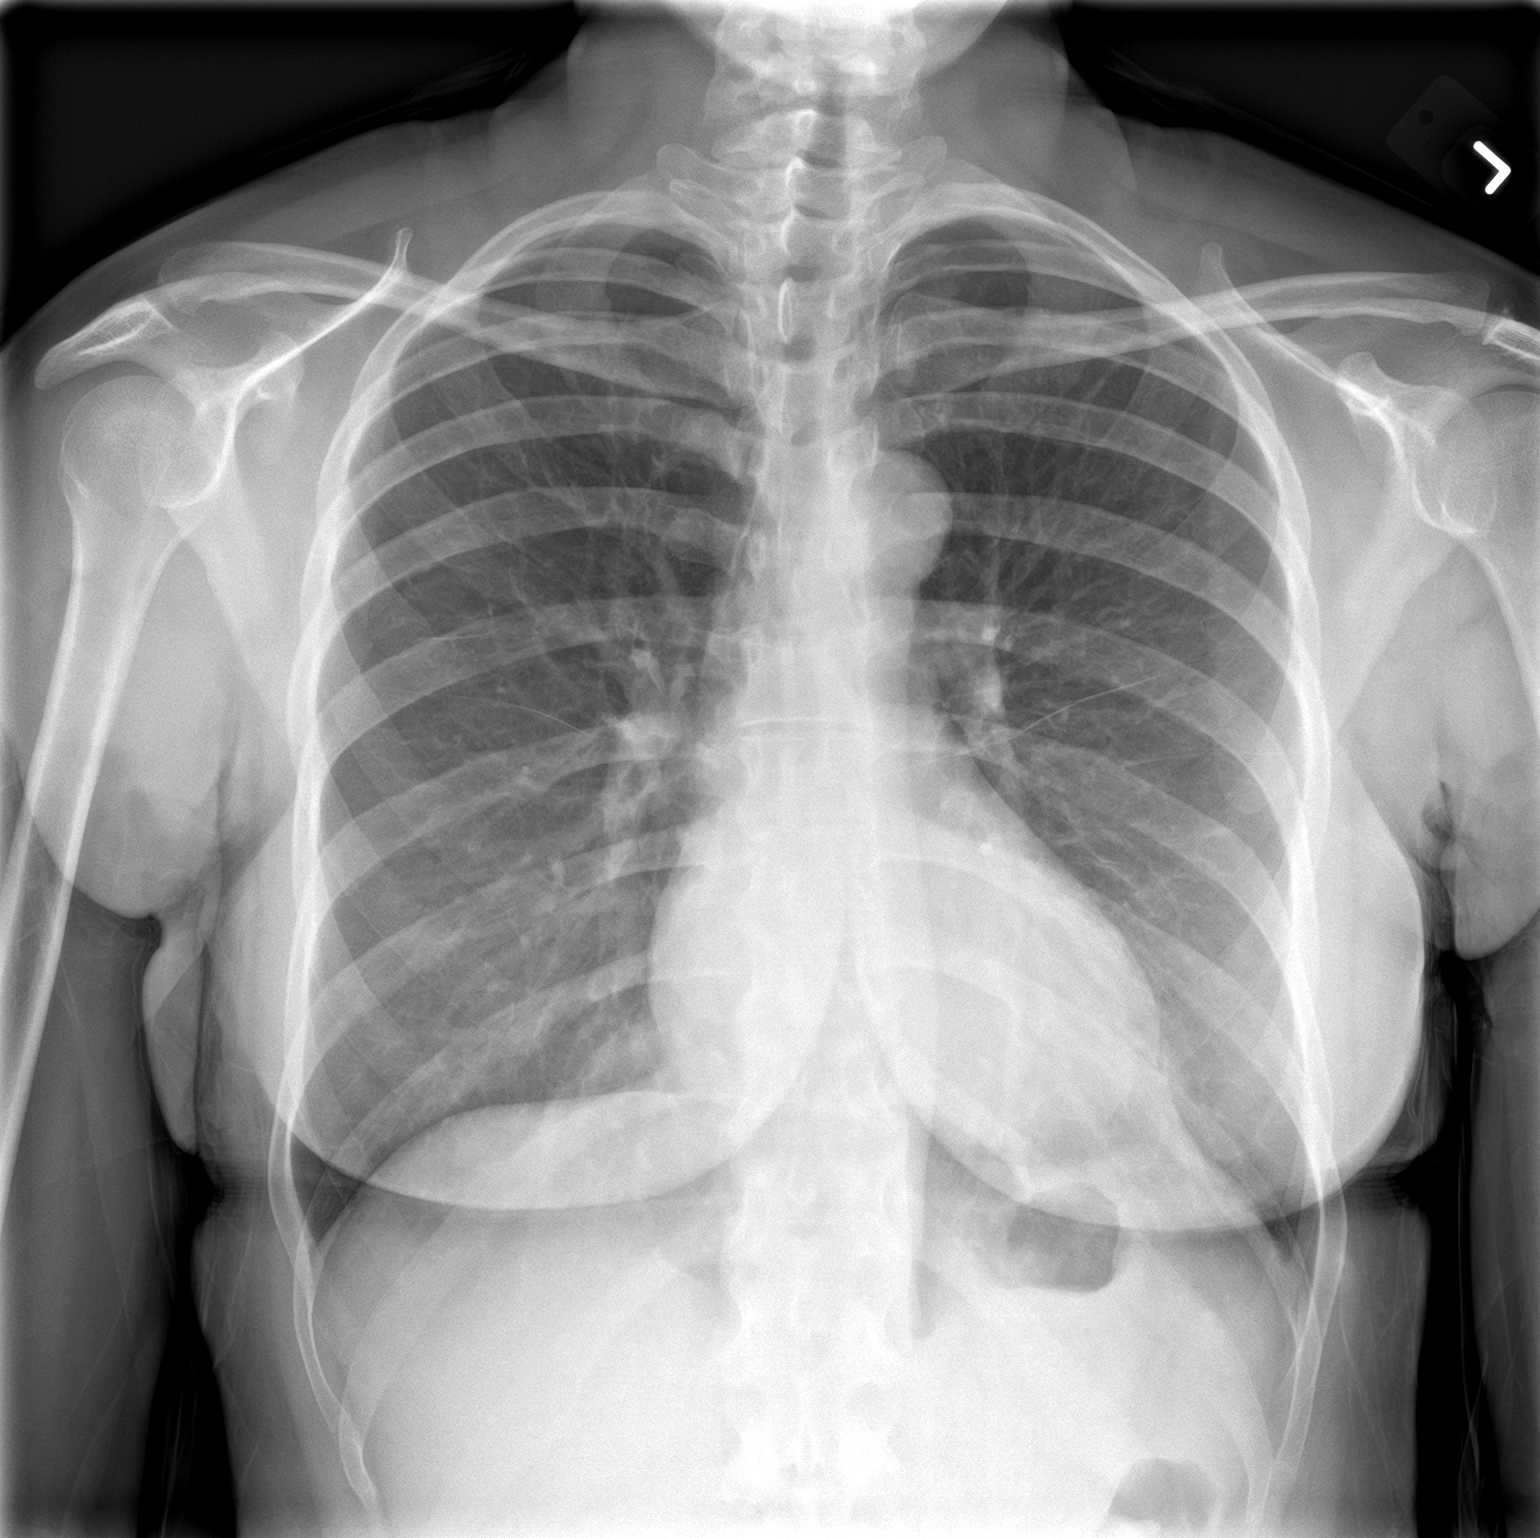

[chest lat]
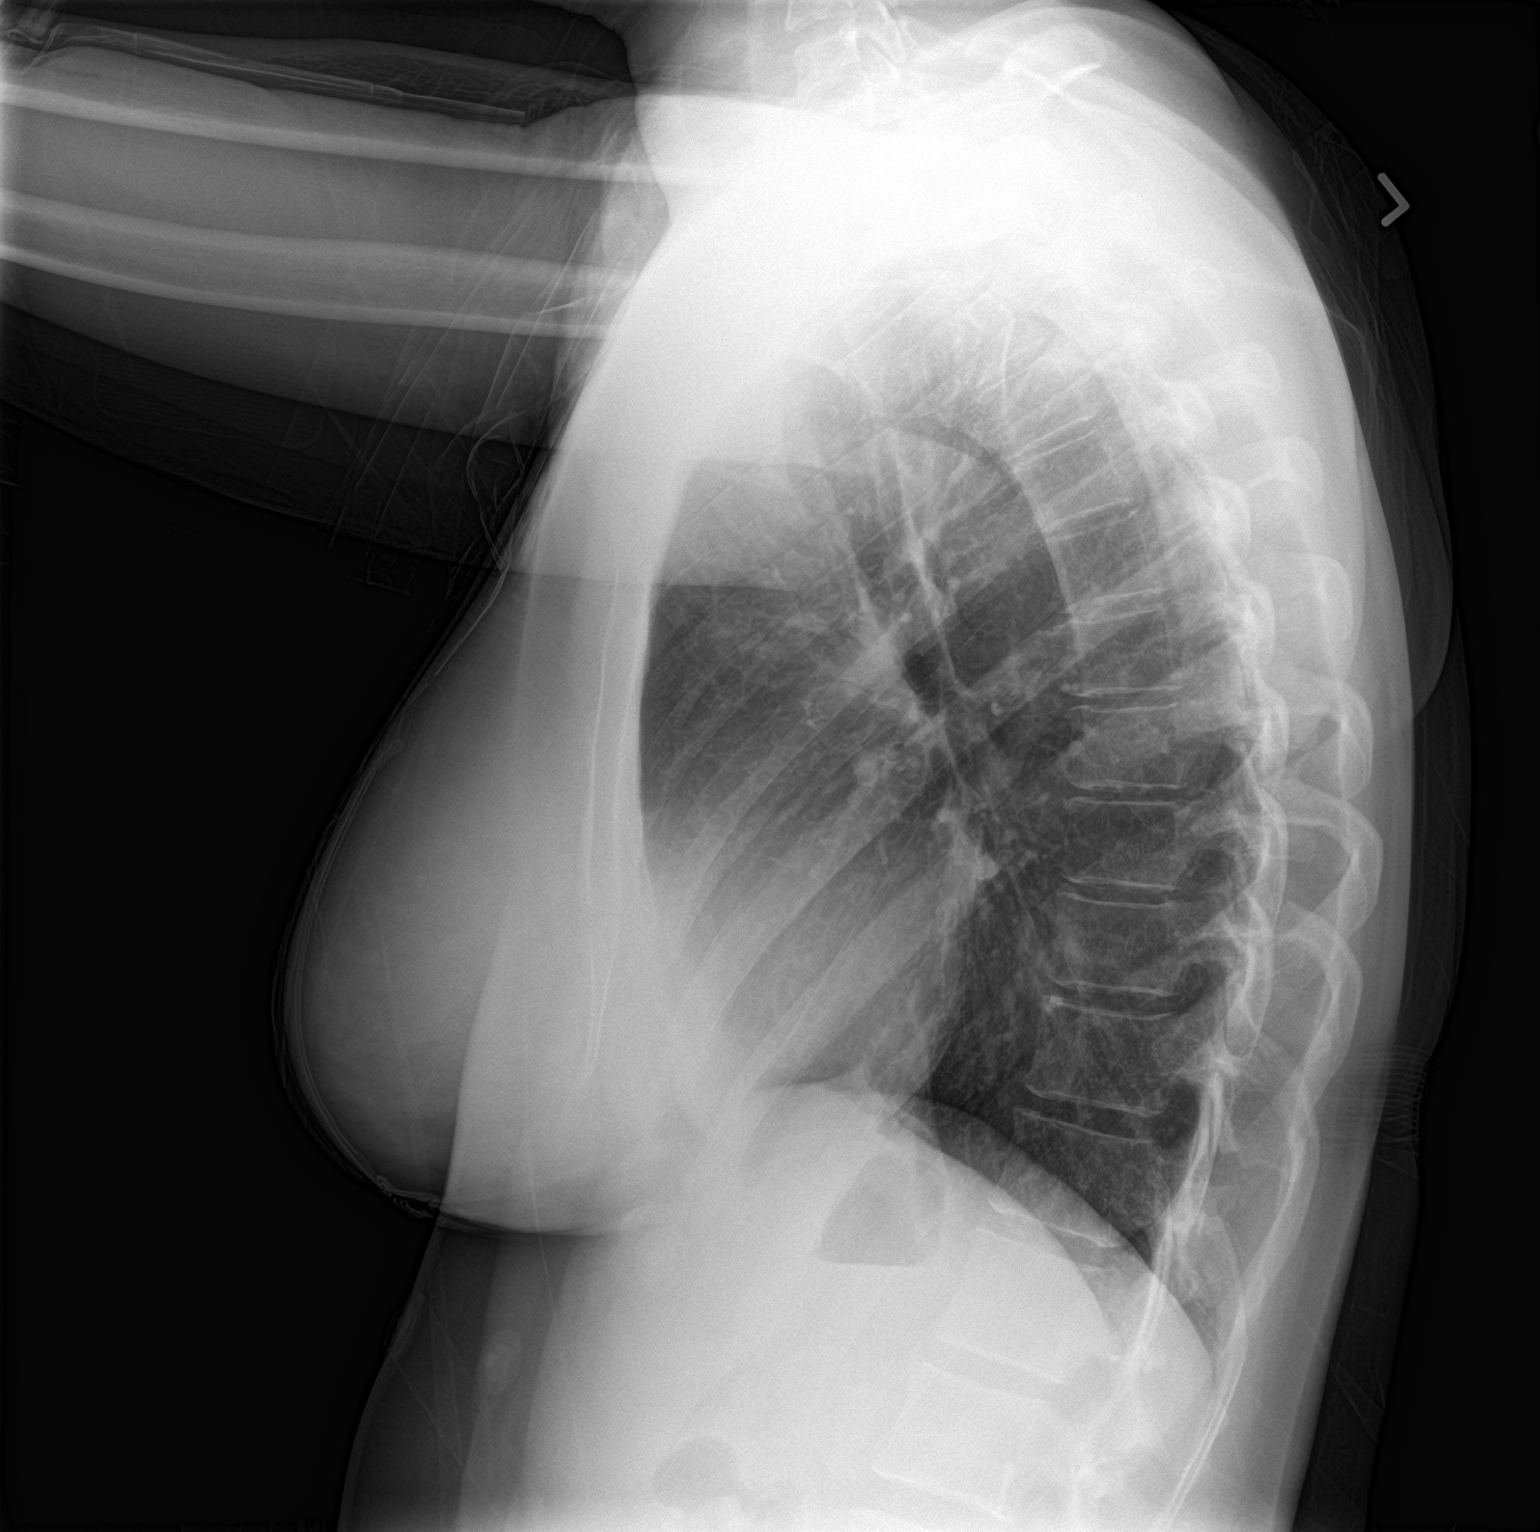

[2 of 2 positions shown; findings below may reference images not displayed]

FINDINGS: The heart size and mediastinal contours are within normal limits.
Both lungs are clear. The visualized skeletal structures are
unremarkable.
IMPRESSION: No active cardiopulmonary disease.
